# Patient Record
Sex: Male | Born: 1985 | Race: White | Hispanic: No | Marital: Married | Smoking: Never smoker
Health system: Southern US, Community
[De-identification: ages and names within clinical notes are randomized; demographics above are authoritative.]

## PROBLEM LIST (undated history)

## (undated) HISTORY — PX: APPENDECTOMY: SHX54

---

## 2010-05-22 ENCOUNTER — Ambulatory Visit (INDEPENDENT_AMBULATORY_CARE_PROVIDER_SITE_OTHER): Payer: Managed Care, Other (non HMO) | Admitting: Psychology

## 2010-05-22 DIAGNOSIS — F909 Attention-deficit hyperactivity disorder, unspecified type: Secondary | ICD-10-CM

## 2010-06-21 ENCOUNTER — Encounter (HOSPITAL_COMMUNITY): Payer: Managed Care, Other (non HMO) | Admitting: Psychology

## 2010-07-10 ENCOUNTER — Encounter (INDEPENDENT_AMBULATORY_CARE_PROVIDER_SITE_OTHER): Payer: Managed Care, Other (non HMO) | Admitting: Psychology

## 2010-07-10 DIAGNOSIS — F909 Attention-deficit hyperactivity disorder, unspecified type: Secondary | ICD-10-CM

## 2010-07-16 ENCOUNTER — Encounter (INDEPENDENT_AMBULATORY_CARE_PROVIDER_SITE_OTHER): Payer: Managed Care, Other (non HMO) | Admitting: Psychology

## 2010-07-16 DIAGNOSIS — F909 Attention-deficit hyperactivity disorder, unspecified type: Secondary | ICD-10-CM

## 2011-11-18 ENCOUNTER — Emergency Department (HOSPITAL_COMMUNITY)
Admission: EM | Admit: 2011-11-18 | Discharge: 2011-11-19 | Disposition: A | Discharge status: Home / Self Care | Payer: Managed Care, Other (non HMO) | Attending: Emergency Medicine | Admitting: Emergency Medicine

## 2011-11-18 DIAGNOSIS — IMO0002 Reserved for concepts with insufficient information to code with codable children: Secondary | ICD-10-CM | POA: Insufficient documentation

## 2011-11-18 DIAGNOSIS — T169XXA Foreign body in ear, unspecified ear, initial encounter: Secondary | ICD-10-CM | POA: Insufficient documentation

## 2011-11-18 DIAGNOSIS — T162XXA Foreign body in left ear, initial encounter: Secondary | ICD-10-CM

## 2011-11-18 NOTE — ED Provider Notes (Signed)
History     CSN: [id number]  Arrival date & time 11/18/11  2321   First MD Initiated Contact with Patient 11/18/11 2343      No chief complaint on file.   (Consider location/radiation/quality/duration/timing/severity/associated sxs/prior treatment) HPI Comments: The patient reports that he had acute onset of left ear pain just prior to arrival when he felt like an insect flew into his left ear. The symptoms are constant, rapidly improved after he poured mineral oil into his left ear and tried to irrigate out the bug. The insect is no longer moving, he has no associated vertigo.  The history is provided by the patient and the spouse.    History reviewed. No pertinent past medical history.  History reviewed. No pertinent past surgical history.  History reviewed. No pertinent family history.  History  Substance Use Topics  . Smoking status: Not on file  . Smokeless tobacco: Not on file  . Alcohol Use: No      Review of Systems  HENT: Negative for ear discharge.   Gastrointestinal: Negative for nausea and vomiting.  Neurological: Negative for dizziness.    Allergies  Review of patient's allergies indicates no known allergies.  Home Medications  No current outpatient prescriptions on file.  BP 130/72  Pulse 98  Temp 97.9 F (36.6 C) (Oral)  Resp 24  Ht 6\' 4"  (1.93 m)  Wt 230 lb (104.327 kg)  BMI 28.00 kg/m2  SpO2 98%  Physical Exam  Nursing note and vitals reviewed. Constitutional: He appears well-developed and well-nourished. No distress.  HENT:  Head: Normocephalic and atraumatic.       Right external auditory canal is clear, tympanic membrane normal, left tympanic membrane is erythematous, external auditory canal has insect present which is not moving.  Pulmonary/Chest: Effort normal.  Neurological: He is alert. Coordination normal.       Normal speech memory and gait  Skin: Skin is warm and dry.  Psychiatric: He has a normal mood and affect.    ED  Course  FOREIGN BODY REMOVAL Date/Time: 11/19/2011 1:29 AM Performed by: Eber Hong D Authorized by: Eber Hong D Consent: Verbal consent obtained. Risks and benefits: risks, benefits and alternatives were discussed Consent given by: patient Patient understanding: patient states understanding of the procedure being performed Patient identity confirmed: verbally with patient Body area: ear Location details: left ear Local anesthetic: topical anesthetic Patient sedated: no Patient cooperative: yes Localization method: ENT speculum Removal mechanism: suction Complexity: simple 1 objects recovered. Objects recovered: insect Post-procedure assessment: foreign body removed Patient tolerance: Patient tolerated the procedure well with no immediate complications.   (including critical care time)  Labs Reviewed - No data to display No results found.   1. Foreign body of ear, left       MDM  Will attempt to remove foreign body, the insect appears dead at this time, the patient is in no distress   Insect removed in completeness, patient well appearing stable for discharge. No signs of injury to the tympanic membrane.  Vida Roller, MD 11/19/11 0130

## 2011-11-19 ENCOUNTER — Encounter (HOSPITAL_COMMUNITY): Payer: Self-pay | Admitting: *Deleted

## 2011-11-19 MED ORDER — LIDOCAINE VISCOUS 2 % MT SOLN
OROMUCOSAL | Status: AC
  Administered 2011-11-19: 01:00:00
  Filled 2011-11-19: qty 15

## 2011-11-19 NOTE — ED Notes (Signed)
Pt reporting bug in ear, noted tonight.

## 2014-02-27 ENCOUNTER — Encounter (HOSPITAL_COMMUNITY): Payer: Self-pay | Admitting: *Deleted

## 2014-02-27 ENCOUNTER — Emergency Department (HOSPITAL_COMMUNITY): Payer: BC Managed Care – PPO

## 2014-02-27 ENCOUNTER — Emergency Department (HOSPITAL_COMMUNITY)
Admission: EM | Admit: 2014-02-27 | Discharge: 2014-02-28 | Disposition: A | Discharge status: Home / Self Care | Payer: BC Managed Care – PPO | Attending: Emergency Medicine | Admitting: Emergency Medicine

## 2014-02-27 DIAGNOSIS — Y9366 Activity, soccer: Secondary | ICD-10-CM | POA: Insufficient documentation

## 2014-02-27 DIAGNOSIS — S93402A Sprain of unspecified ligament of left ankle, initial encounter: Secondary | ICD-10-CM | POA: Diagnosis not present

## 2014-02-27 DIAGNOSIS — Y998 Other external cause status: Secondary | ICD-10-CM | POA: Diagnosis not present

## 2014-02-27 DIAGNOSIS — S99912A Unspecified injury of left ankle, initial encounter: Secondary | ICD-10-CM | POA: Diagnosis present

## 2014-02-27 DIAGNOSIS — X58XXXA Exposure to other specified factors, initial encounter: Secondary | ICD-10-CM | POA: Insufficient documentation

## 2014-02-27 DIAGNOSIS — T1490XA Injury, unspecified, initial encounter: Secondary | ICD-10-CM

## 2014-02-27 DIAGNOSIS — Y9239 Other specified sports and athletic area as the place of occurrence of the external cause: Secondary | ICD-10-CM | POA: Insufficient documentation

## 2014-02-27 MED ORDER — ONDANSETRON HCL 4 MG PO TABS: 4.0000 mg | ORAL_TABLET | Freq: Four times a day (QID) | ORAL | Status: DC | PRN

## 2014-02-27 MED ORDER — HYDROCODONE-ACETAMINOPHEN 5-325 MG PO TABS: ORAL_TABLET | ORAL | Status: DC

## 2014-02-27 MED ORDER — ONDANSETRON 4 MG PO TBDP
4.0000 mg | ORAL_TABLET | Freq: Once | ORAL | Status: AC
  Administered 2014-02-27: 4 mg via ORAL
  Filled 2014-02-27: qty 1

## 2014-02-27 MED ORDER — HYDROCODONE-ACETAMINOPHEN 5-325 MG PO TABS
1.0000 | ORAL_TABLET | Freq: Once | ORAL | Status: AC
  Administered 2014-02-27: 1 via ORAL
  Filled 2014-02-27: qty 1

## 2014-02-27 NOTE — ED Notes (Signed)
Patient was playing soccer in an indoor league and rolled his left ankle   Ankle swelling, ice bag applied

## 2014-02-27 NOTE — Discharge Instructions (Signed)
Rest, Ice intermittently (in the first 24-48 hours), Gentle compression with an Ace wrap, and elevate (Limb above the level of the heart) °  °Take up to 800mg of ibuprofen (that is usually 4 over the counter pills)  3 times a day for 5 days. Take with food. ° °Take vicodin for breakthrough pain, do not drink alcohol, drive, care for children or do other critical tasks while taking vicodin. ° °Please follow with your primary care doctor in the next 2 days for a check-up. They must obtain records for further management.  ° °Do not hesitate to return to the Emergency Department for any new, worsening or concerning symptoms.  ° ° °Ankle Sprain °An ankle sprain is an injury to the strong, fibrous tissues (ligaments) that hold the bones of your ankle joint together.  °CAUSES °An ankle sprain is usually caused by a fall or by twisting your ankle. Ankle sprains most commonly occur when you step on the outer edge of your foot, and your ankle turns inward. People who participate in sports are more prone to these types of injuries.  °SYMPTOMS  °· Pain in your ankle. The pain may be present at rest or only when you are trying to stand or walk. °· Swelling. °· Bruising. Bruising may develop immediately or within 1 to 2 days after your injury. °· Difficulty standing or walking, particularly when turning corners or changing directions. °DIAGNOSIS  °Your caregiver will ask you details about your injury and perform a physical exam of your ankle to determine if you have an ankle sprain. During the physical exam, your caregiver will press on and apply pressure to specific areas of your foot and ankle. Your caregiver will try to move your ankle in certain ways. An X-ray exam may be done to be sure a bone was not broken or a ligament did not separate from one of the bones in your ankle (avulsion fracture).  °TREATMENT  °Certain types of braces can help stabilize your ankle. Your caregiver can make a recommendation for this. Your  caregiver may recommend the use of medicine for pain. If your sprain is severe, your caregiver may refer you to a surgeon who helps to restore function to parts of your skeletal system (orthopedist) or a physical therapist. °HOME CARE INSTRUCTIONS  °· Apply ice to your injury for 1-2 days or as directed by your caregiver. Applying ice helps to reduce inflammation and pain. °¨ Put ice in a plastic bag. °¨ Place a towel between your skin and the bag. °¨ Leave the ice on for 15-20 minutes at a time, every 2 hours while you are awake. °· Only take over-the-counter or prescription medicines for pain, discomfort, or fever as directed by your caregiver. °· Elevate your injured ankle above the level of your heart as much as possible for 2-3 days. °· If your caregiver recommends crutches, use them as instructed. Gradually put weight on the affected ankle. Continue to use crutches or a cane until you can walk without feeling pain in your ankle. °· If you have a plaster splint, wear the splint as directed by your caregiver. Do not rest it on anything harder than a pillow for the first 24 hours. Do not put weight on it. Do not get it wet. You may take it off to take a shower or bath. °· You may have been given an elastic bandage to wear around your ankle to provide support. If the elastic bandage is too tight (you have numbness   or tingling in your foot or your foot becomes cold and blue), adjust the bandage to make it comfortable. °· If you have an air splint, you may blow more air into it or let air out to make it more comfortable. You may take your splint off at night and before taking a shower or bath. Wiggle your toes in the splint several times per day to decrease swelling. °SEEK MEDICAL CARE IF:  °· You have rapidly increasing bruising or swelling. °· Your toes feel extremely cold or you lose feeling in your foot. °· Your pain is not relieved with medicine. °SEEK IMMEDIATE MEDICAL CARE IF: °· Your toes are numb or  blue. °· You have severe pain that is increasing. °MAKE SURE YOU:  °· Understand these instructions. °· Will watch your condition. °· Will get help right away if you are not doing well or get worse. °Document Released: 03/10/2005 Document Revised: 12/03/2011 Document Reviewed: 03/22/2011 °[employer]® Patient Information ©2015 [employer]. This information is not intended to replace advice given to you by your health care provider. Make sure you discuss any questions you have with your health care provider. ° °

## 2014-02-27 NOTE — ED Provider Notes (Signed)
CSN: [id number]     Arrival date & time 02/27/14  2114 History  This chart was scribed for Monico Blitz, PA-C, working with Evelina Bucy, MD found by Starleen Arms, ED Scribe. This patient was seen in room TR09C/TR09C and the patient's care was started at 11:20 PM.  Chief Complaint  Patient presents with  . Ankle Injury   The history is provided by the patient. No language interpreter was used.   HPI Comments: Brandon Navarro is a 28 y.o. male who presents to the Emergency Department complaining of a rolling left ankle injury sustained tonight while playing indoor soccer.  He reports current 4/10 pain in the affected ankle that is aggravated by bearing weight. Patient denies other injuries.    History reviewed. No pertinent past medical history. Past Surgical History  Procedure Laterality Date  . Appendectomy     No family history on file. History  Substance Use Topics  . Smoking status: Never Smoker   . Smokeless tobacco: Never Used  . Alcohol Use: Yes     Comment: ocassionally    Review of Systems A complete 10 system review of systems was obtained and all systems are negative except as noted in the HPI and PMH.   Allergies  Review of patient's allergies indicates no known allergies.  Home Medications   Prior to Admission medications   Not on File   BP 129/81 mmHg  Pulse 71  Temp(Src) 98.5 F (36.9 C) (Oral)  Resp 18  Ht 6\' 4"  (1.93 m)  Wt 226 lb (102.513 kg)  BMI 27.52 kg/m2  SpO2 96% Physical Exam  Constitutional: He is oriented to person, place, and time. He appears well-developed and well-nourished. No distress.  HENT:  Head: Normocephalic and atraumatic.  Eyes: Conjunctivae and EOM are normal.  Neck: Neck supple. No tracheal deviation present.  Cardiovascular: Normal rate.   Pulmonary/Chest: Effort normal. No respiratory distress.  Musculoskeletal:  Left ankle with significantly swollen inferior lateral malleolus.  NVI.  No skin changes.     Neurological: He is alert and oriented to person, place, and time.  Skin: Skin is warm and dry.  Psychiatric: He has a normal mood and affect. His behavior is normal.  Nursing note and vitals reviewed.   ED Course  Procedures (including critical care time)  DIAGNOSTIC STUDIES: Oxygen Saturation is 96% on RA, adeqaute by my interpretation.    COORDINATION OF CARE:  11:24 PM Discussed treatment plan with patient at bedside including pain medication, ACE wrap, crutches, .  Patient acknowledges and agrees with plan.    Labs Review Labs Reviewed - No data to display  Imaging Review Dg Ankle Complete Left  02/27/2014   CLINICAL DATA:  Left lateral ankle pain. Injury tonight; pt rolled his ankle while playing indoor soccer.No previous injury, limited weight bearing.  EXAM: LEFT ANKLE COMPLETE - 3+ VIEW  COMPARISON:  None.  FINDINGS: No fracture. Ankle mortise is normally spaced and aligned. No arthropathic change. There is lateral soft tissue swelling.  IMPRESSION: No fracture or dislocation.   Electronically Signed   By: Lajean Manes M.D.   On: 02/27/2014 22:38     EKG Interpretation None      MDM   Final diagnoses:  Left ankle sprain, initial encounter     Filed Vitals:   02/27/14 2125 02/27/14 2335  BP: 129/81 123/69  Pulse: 71 63  Temp: 98.5 F (36.9 C) 98.4 F (36.9 C)  TempSrc: Oral Oral  Resp: 18 20  Height: 6\' 4"  (1.93 m)   Weight: 226 lb (102.513 kg)   SpO2: 96% 100%    Medications  ondansetron (ZOFRAN-ODT) disintegrating tablet 4 mg (4 mg Oral Given 02/27/14 2354)  HYDROcodone-acetaminophen (NORCO/VICODIN) 5-325 MG per tablet 1 tablet (1 tablet Oral Given 02/27/14 2354)    Brandon Navarro is a pleasant 28 y.o. male presenting with ankle pain. Patient is ambulatory. No point tenderness or bony tenderness to palpation. Neurovascularly intact. X-rays negative. Patient will be given crutches, Ace wrap, and advised to maintain RICE.   Evaluation does not  show pathology that would require ongoing emergent intervention or inpatient treatment. Pt is hemodynamically stable and mentating appropriately. Discussed findings and plan with patient/guardian, who agrees with care plan. All questions answered. Return precautions discussed and outpatient follow up given.   Discharge Medication List as of 02/27/2014 11:32 PM    START taking these medications   Details  HYDROcodone-acetaminophen (NORCO/VICODIN) 5-325 MG per tablet Take 1-2 tablets by mouth every 6 hours as needed for pain., Print    ondansetron (ZOFRAN) 4 MG tablet Take 1 tablet (4 mg total) by mouth every 6 (six) hours as needed for nausea or vomiting., Starting 02/27/2014, Until Discontinued, Print       I personally performed the services described in this documentation, which was scribed in my presence.  The recorded information has been reviewed and is accurate.   Monico Blitz, PA-C 02/28/14 0040  Evelina Bucy, MD 02/28/14 [phone]

## 2016-07-01 IMAGING — CR DG ANKLE COMPLETE 3+V*L*
3 series · 3 of 3 positions shown · non-contrast
Comparison: None.

CLINICAL DATA: Left lateral ankle pain. Injury tonight; pt rolled
his ankle while playing indoor soccer.No previous injury, limited
weight bearing.

EXAM:
LEFT ANKLE COMPLETE - 3+ VIEW

[ankle ap]
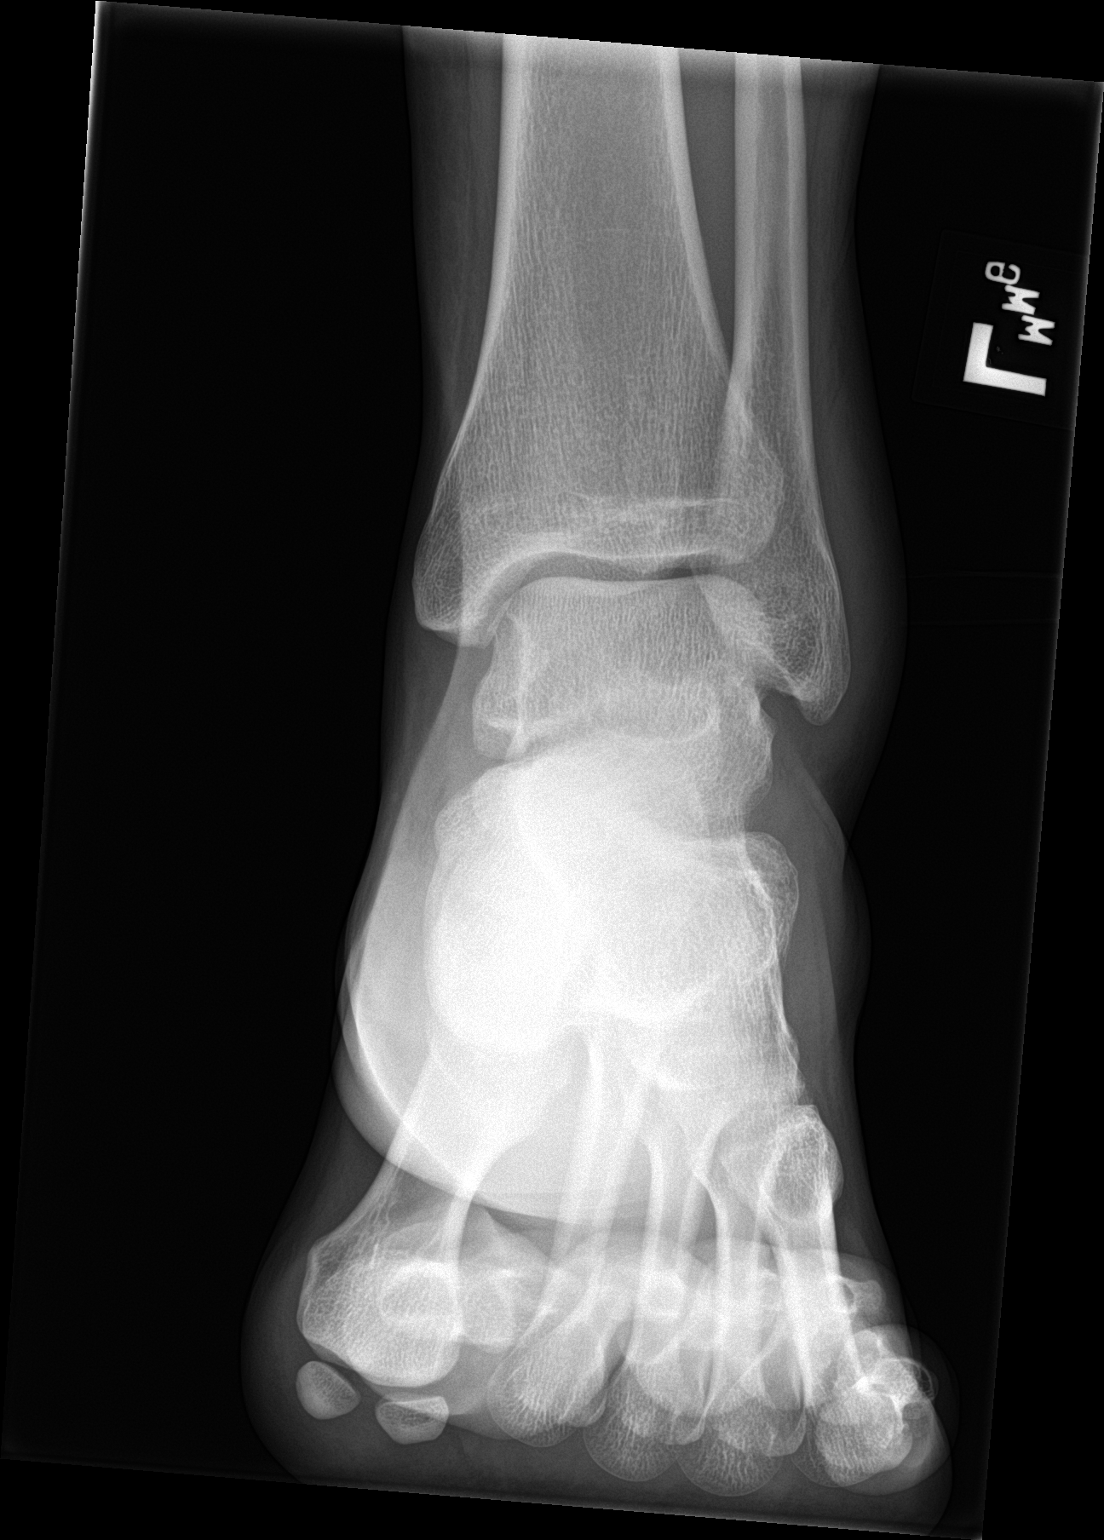

[ankle obl]
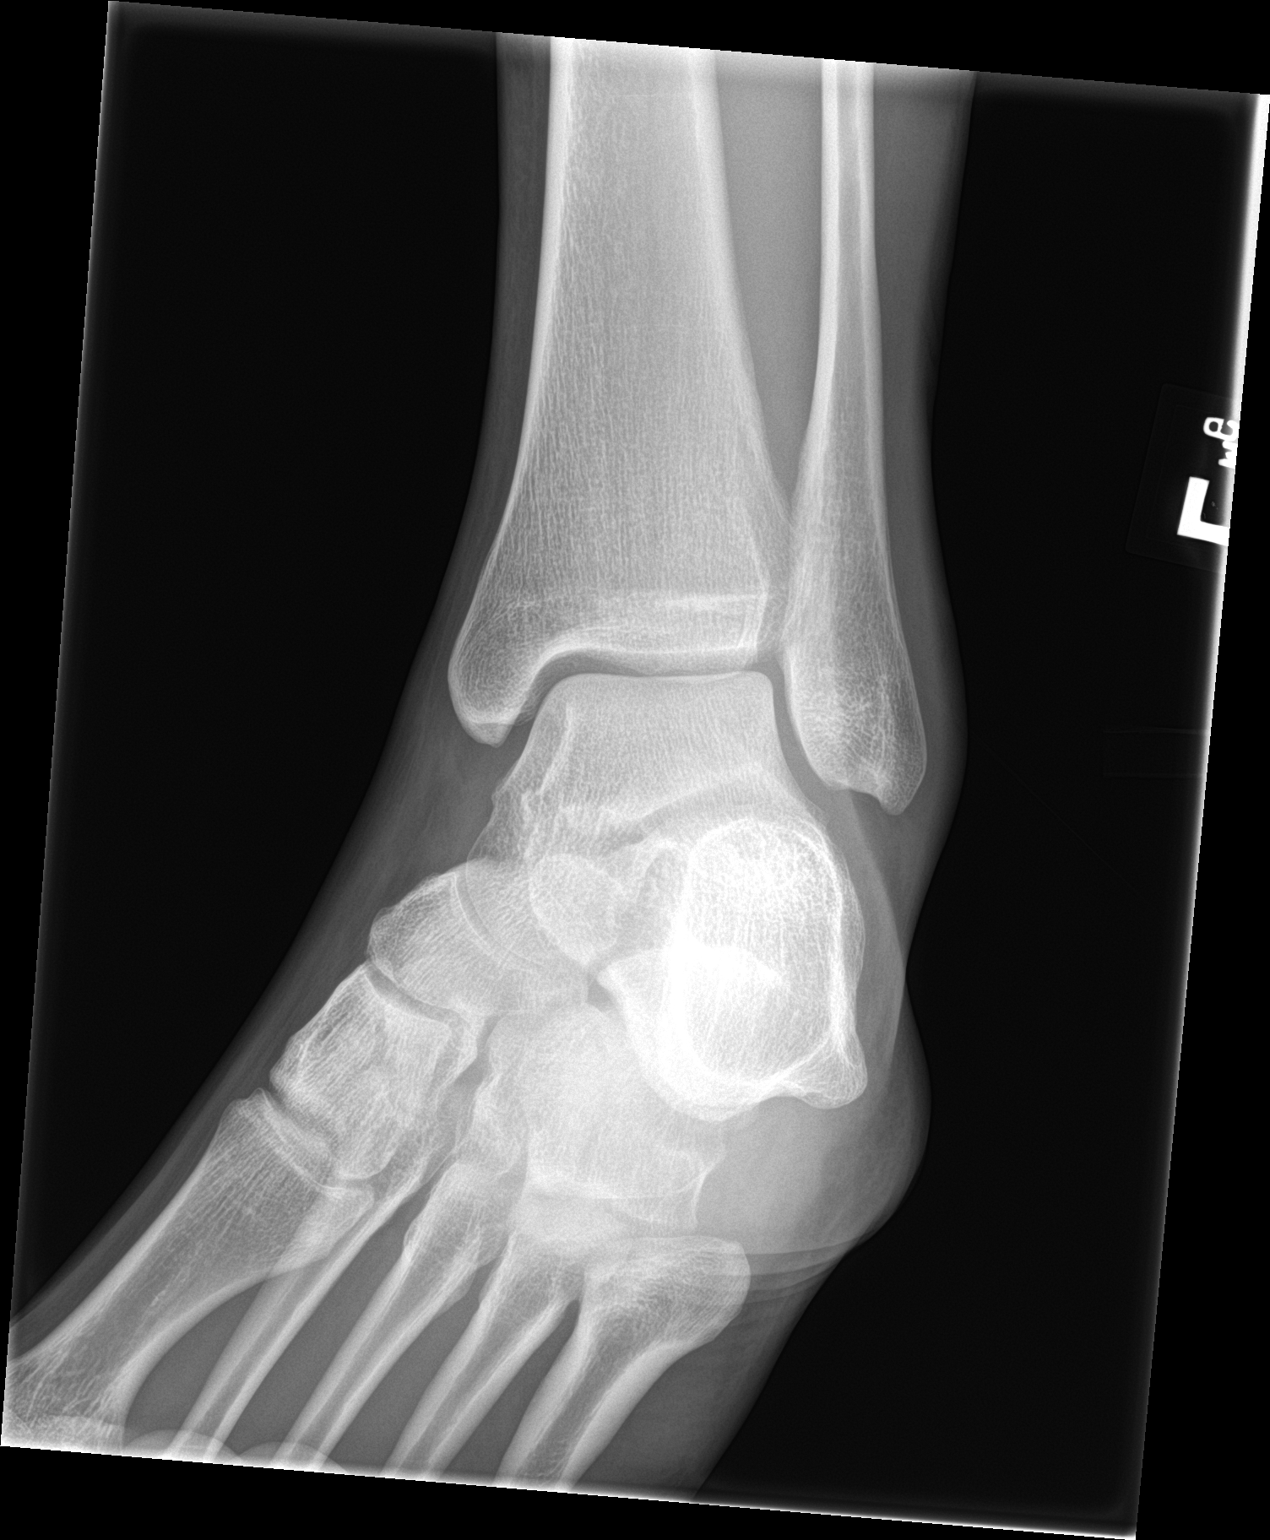

[ankle lat]
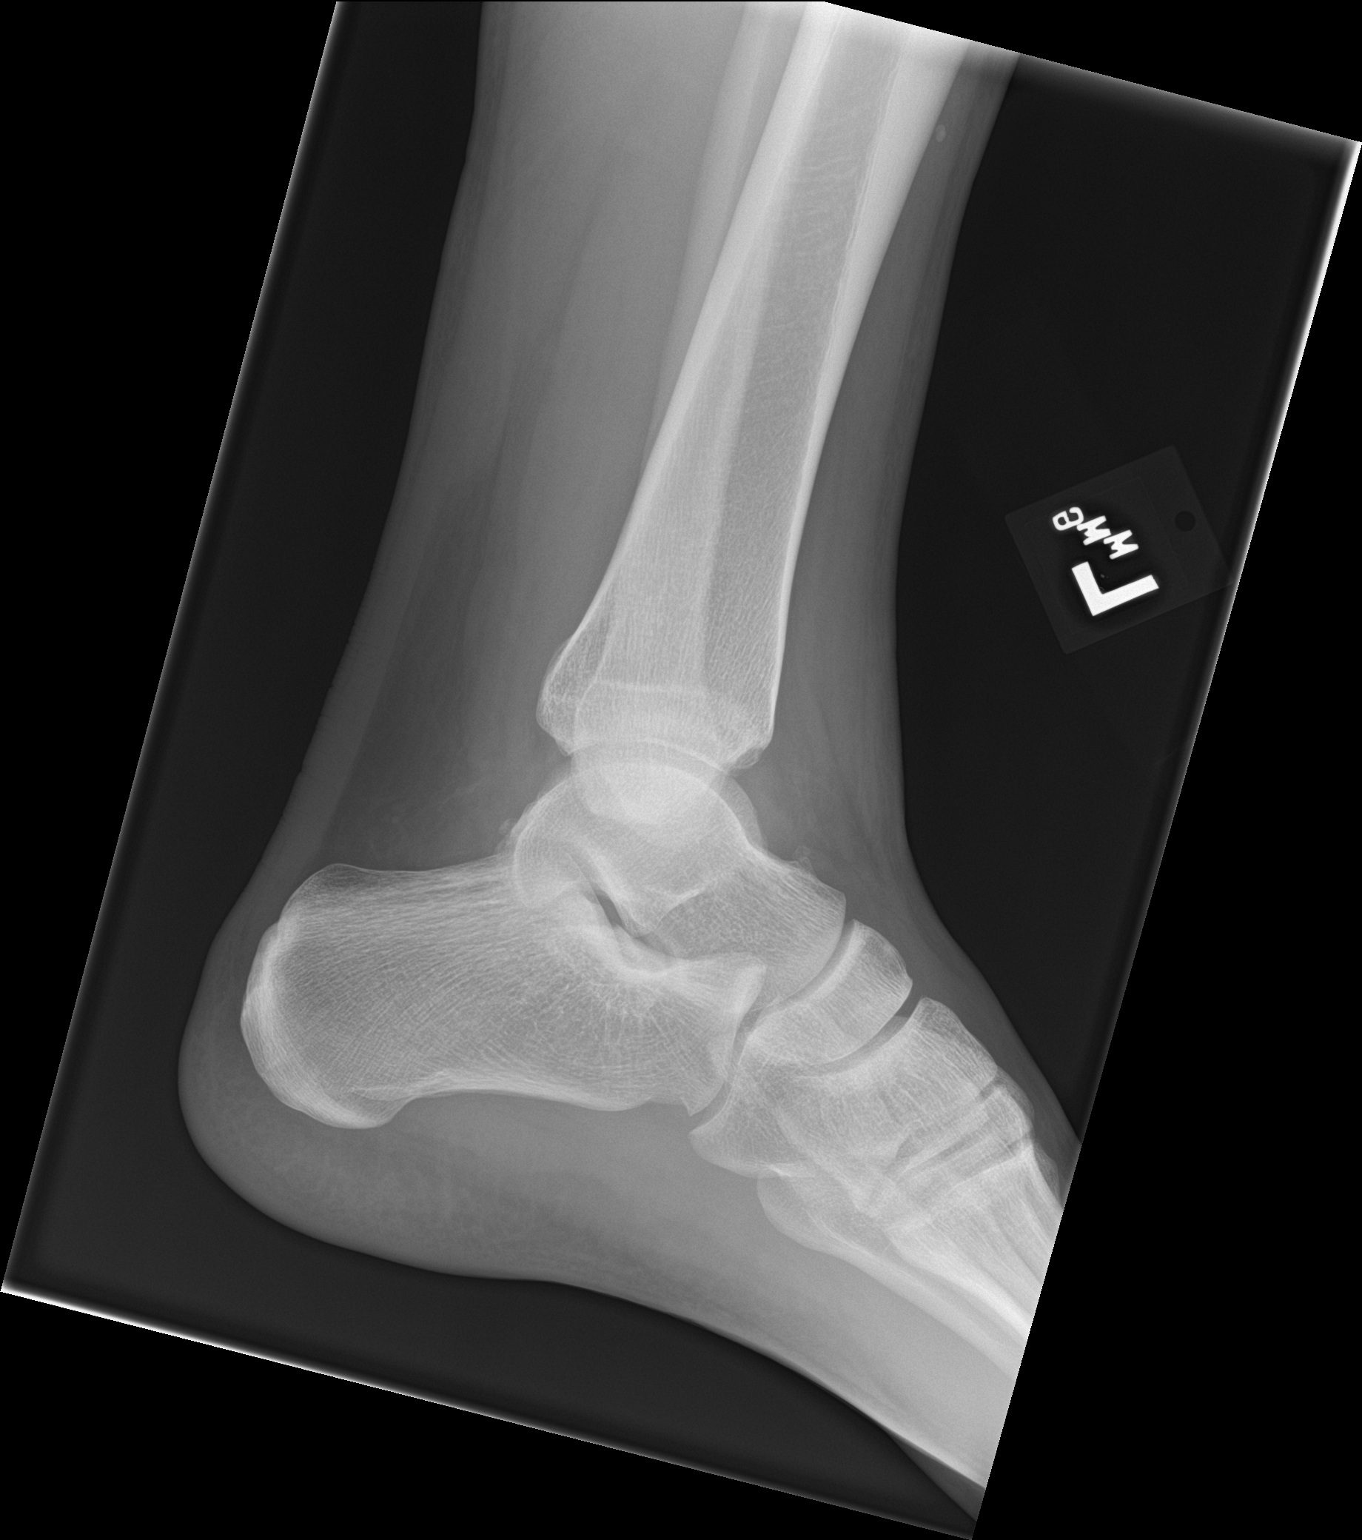

[3 of 3 positions shown; findings below may reference images not displayed]

FINDINGS: No fracture. Ankle mortise is normally spaced and aligned. No
arthropathic change. There is lateral soft tissue swelling.
IMPRESSION: No fracture or dislocation.

## 2017-02-13 DIAGNOSIS — Z23 Encounter for immunization: Secondary | ICD-10-CM | POA: Diagnosis not present

## 2017-08-27 DIAGNOSIS — Z0000 Encounter for general adult medical examination without abnormal findings: Secondary | ICD-10-CM | POA: Diagnosis not present

## 2021-10-08 ENCOUNTER — Ambulatory Visit (INDEPENDENT_AMBULATORY_CARE_PROVIDER_SITE_OTHER): Payer: 59 | Admitting: Dermatology

## 2021-10-08 DIAGNOSIS — L821 Other seborrheic keratosis: Secondary | ICD-10-CM

## 2021-10-08 DIAGNOSIS — L918 Other hypertrophic disorders of the skin: Secondary | ICD-10-CM | POA: Diagnosis not present

## 2021-10-08 DIAGNOSIS — D1801 Hemangioma of skin and subcutaneous tissue: Secondary | ICD-10-CM

## 2021-10-08 DIAGNOSIS — Z1283 Encounter for screening for malignant neoplasm of skin: Secondary | ICD-10-CM

## 2021-10-08 DIAGNOSIS — B078 Other viral warts: Secondary | ICD-10-CM

## 2021-10-28 ENCOUNTER — Encounter: Payer: Self-pay | Admitting: Dermatology

## 2021-10-28 NOTE — Progress Notes (Signed)
   New Patient   Subjective  Brandon Navarro is a 36 y.o. male who presents for the following: New Patient (Initial Visit) (Here for new patient skin exam. Family history of skin cancer. Patients grandfather died of melanoma. No concerns for today.).  General skin examination, grandparent with history of melanoma Location:  Duration:  Quality:  Associated Signs/Symptoms: Modifying Factors:  Severity:  Timing: Context:    The following portions of the chart were reviewed this encounter and updated as appropriate:  Tobacco  Allergies  Meds  Problems  Med Hx  Surg Hx  Fam Hx      Objective  Well appearing patient in no apparent distress; mood and affect are within normal limits. Full body skin exam: No atypical pigmented lesions (all checked with dermoscopy), no nonmelanoma skin cancer  Right Supraorbital Region 2 mm smooth red dermal papule  Left Anterior Neck Several 1 mm pedunculated flesh-colored papules  Right Thigh - Anterior Tan textured 4 mm slightly verrucous papule  Left Dorsal Hand Pink 3 mm textured papule    A full examination was performed including scalp, head, eyes, ears, nose, lips, neck, chest, axillae, abdomen, back, buttocks, bilateral upper extremities, bilateral lower extremities, hands, feet, fingers, toes, fingernails, and toenails. All findings within normal limits unless otherwise noted below.   Assessment & Plan  Screening for malignant neoplasm of skin  Encouraged to self examine with spouse twice annually, continued ultraviolet protection.  Optional visit to dermatologist every 1 to 3 years.  Hemangioma of skin Right Supraorbital Region  No intervention necessary  Skin tag Left Anterior Neck  May choose to remove in future  Seborrheic keratosis Right Thigh - Anterior  Leave if stable  Other viral warts Left Dorsal Hand  May try at home freezing

## 2023-05-19 ENCOUNTER — Encounter: Payer: Self-pay | Admitting: "Endocrinology

## 2023-05-19 ENCOUNTER — Ambulatory Visit (INDEPENDENT_AMBULATORY_CARE_PROVIDER_SITE_OTHER): Payer: 59 | Admitting: "Endocrinology

## 2023-05-19 VITALS — BP 104/76 | HR 68 | Ht 75.0 in | Wt 228.6 lb

## 2023-05-19 DIAGNOSIS — E291 Testicular hypofunction: Secondary | ICD-10-CM | POA: Diagnosis not present

## 2023-05-19 DIAGNOSIS — E782 Mixed hyperlipidemia: Secondary | ICD-10-CM | POA: Diagnosis not present

## 2023-05-19 NOTE — Progress Notes (Unsigned)
 Endocrinology Consult Note                                            05/19/2023, 3:28 PM   Subjective:    Patient ID: Brandon Navarro, male    DOB: [birth year], PCP Selinda Flavin, MD   History reviewed. No pertinent past medical history. Past Surgical History:  Procedure Laterality Date  . APPENDECTOMY     Social History   Socioeconomic History  . Marital status: Married    Spouse name: Not on file  . Number of children: Not on file  . Years of education: Not on file  . Highest education level: Not on file  Occupational History  . Not on file  Tobacco Use  . Smoking status: Never  . Smokeless tobacco: Never  Vaping Use  . Vaping status: Never Used  Substance and Sexual Activity  . Alcohol use: Yes    Comment: ocassionally  . Drug use: No  . Sexual activity: Yes  Other Topics Concern  . Not on file  Social History Narrative  . Not on file   Social Drivers of Health   Financial Resource Strain: Not on file  Food Insecurity: Not on file  Transportation Needs: Not on file  Physical Activity: Not on file  Stress: Not on file  Social Connections: Not on file   Family History  Problem Relation Age of Onset  . Hyperlipidemia Mother    Outpatient Encounter Medications as of 05/19/2023  Medication Sig  . MULTIPLE VITAMINS PO Take 1 tablet by mouth daily.  . [DISCONTINUED] HYDROcodone-acetaminophen (NORCO/VICODIN) 5-325 MG per tablet Take 1-2 tablets by mouth every 6 hours as needed for pain.  . [DISCONTINUED] ondansetron (ZOFRAN) 4 MG tablet Take 1 tablet (4 mg total) by mouth every 6 (six) hours as needed for nausea or vomiting.  . [DISCONTINUED] Testosterone 25 MG/2.5GM (1%) GEL SMARTSIG:1 Packet(s) T-DERMAL Daily   No facility-administered encounter medications on file as of 05/19/2023.   ALLERGIES: No Known Allergies  VACCINATION STATUS:  There is no immunization history on file for this patient.  HPI Brandon Navarro is 38 y.o. male who  presents today with a medical history as above. he is being seen in consultation for *** requested by Selinda Flavin, MD.  he has been dealing with symptoms of  ***, ***, ***, and *** for ***. he denies ***.  Review of Systems  Constitutional: ***mildly fluctuating body weight with recent weight ***, no fatigue, no subjective hyperthermia, no subjective hypothermia Eyes: no blurry vision, no xerophthalmia ENT: no sore throat, no nodules palpated in throat, no dysphagia/odynophagia, no hoarseness Cardiovascular: no Chest Pain, no Shortness of Breath, no palpitations, no leg swelling Respiratory: no cough, no shortness of breath Gastrointestinal: no Nausea/Vomiting/Diarhhea Musculoskeletal: no muscle/joint aches Skin: no rashes Neurological: no tremors, no numbness, no tingling, no dizziness Psychiatric: no depression, no anxiety  Objective:       05/19/2023    2:39 PM 02/27/2014   11:35 PM 02/27/2014    9:25 PM  Vitals with BMI  Height 6\' 3"   6\' 4"   Weight 228 lbs 10 oz  226 lbs  BMI 28.57  27.6  Systolic 104 123 962  Diastolic 76 69 81  Pulse 68 63 71    BP 104/76   Pulse 68   Ht 6\' 3"  (1.905  m)   Wt 228 lb 9.6 oz (103.7 kg)   BMI 28.57 kg/m   Wt Readings from Last 3 Encounters:  05/19/23 228 lb 9.6 oz (103.7 kg)  02/27/14 226 lb (102.5 kg)  11/18/11 230 lb (104.3 kg)    Physical Exam  Constitutional:  Body mass index is 28.57 kg/m.,  not in acute distress, normal state of mind Eyes: PERRLA, EOMI, no exophthalmos ENT: moist mucous membranes, no gross thyromegaly, no gross cervical lymphadenopathy Cardiovascular: normal precordial activity, Regular Rate and Rhythm, no Murmur/Rubs/Gallops Respiratory:  adequate breathing efforts, no gross chest deformity, Clear to auscultation bilaterally Gastrointestinal: abdomen soft, Non -tender, No distension, Bowel Sounds present, no gross organomegaly Musculoskeletal: no gross deformities, strength intact in all four extremities,  no peripheral edema Skin: moist, warm, no rashes Neurological: no tremor with outstretched hands, Deep tendon reflexes normal in bilateral lower extremities.  CMP ( most recent) CMP  No results found for: "NA", "K", "CL", "CO2", "GLUCOSE", "BUN", "CREATININE", "CALCIUM", "PROT", "ALBUMIN", "AST", "ALT", "ALKPHOS", "BILITOT", "GFR", "EGFR", "GFRNONAA"   Diabetic Labs (most recent): No results found for: "HGBA1C", "MICROALBUR"   Lipid Panel ( most recent) Lipid Panel  No results found for: "CHOL", "TRIG", "HDL", "CHOLHDL", "VLDL", "LDLCALC", "LDLDIRECT", "LABVLDL"    No results found for: "TSH", "FREET4"         Assessment & Plan:   1. 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 Mixed hyperlipidemia *** - Lipid panel   - Brandon Navarro  is being seen at a kind request of Selinda Flavin, MD. - I have reviewed his available *** records and clinically evaluated the patient. - Based on these reviews, he has ***,  however,  there is not sufficient information to proceed with definitive treatment plan.  - he will need a repeat,  more complete *** towards confirming the diagnosis.  -he will return in *** week to review his repeat labs.   If his  labs are suggestive of ***, he will be considered for *** to confirm the diagnosis.  - I did not initiate any new prescriptions today. - he is advised to maintain close follow up with Selinda Flavin, MD for primary care needs.   -Thank you for involving me in the care of this pleasant patient.  Time spent with the patient: ***  minutes, of which >50% was spent in  counseling him about his *** and the rest in obtaining information about his symptoms, reviewing his previous labs/studies ( including abstractions from other facilities),  evaluations, and treatments,  and developing a plan to  confirm diagnosis and long term treatment based on the latest standards of care/guidelines; and documenting his care.  Hollace Hayward participated in the discussions, expressed understanding, and voiced agreement with the above plans.  All questions were answered to his satisfaction. he is encouraged to contact clinic should he have any questions or concerns prior to his return visit.  Follow up plan: Return in about 2 weeks (around 06/02/2023) for Fasting Labs  in AM B4 8.   Marquis Lunch, MD [facility] [address] [place], [place] [address] Phone: [phone]  Fax: [phone]     05/19/2023, 3:28 PM  This note was partially dictated with voice recognition software. Similar sounding words can be transcribed inadequately or may not  be corrected upon review.

## 2023-05-30 LAB — CBC WITH DIFFERENTIAL/PLATELET
Basophils Absolute: 0 10*3/uL (ref 0.0–0.2)
Basos: 0 %
EOS (ABSOLUTE): 0.1 10*3/uL (ref 0.0–0.4)
Eos: 3 %
Hematocrit: 46.2 % (ref 37.5–51.0)
Hemoglobin: 15.1 g/dL (ref 13.0–17.7)
Immature Grans (Abs): 0 10*3/uL (ref 0.0–0.1)
Immature Granulocytes: 0 %
Lymphocytes Absolute: 2 10*3/uL (ref 0.7–3.1)
Lymphs: 40 %
MCH: 29.8 pg (ref 26.6–33.0)
MCHC: 32.7 g/dL (ref 31.5–35.7)
MCV: 91 fL (ref 79–97)
Monocytes Absolute: 0.6 10*3/uL (ref 0.1–0.9)
Monocytes: 12 %
Neutrophils Absolute: 2.3 10*3/uL (ref 1.4–7.0)
Neutrophils: 45 %
Platelets: 179 10*3/uL (ref 150–450)
RBC: 5.07 x10E6/uL (ref 4.14–5.80)
RDW: 12.9 % (ref 11.6–15.4)
WBC: 5.1 10*3/uL (ref 3.4–10.8)

## 2023-05-30 LAB — PROLACTIN: Prolactin: 8.6 ng/mL (ref 3.9–22.7)

## 2023-05-30 LAB — LIPID PANEL
Chol/HDL Ratio: 4.9 ratio (ref 0.0–5.0)
Cholesterol, Total: 210 mg/dL — ABNORMAL HIGH (ref 100–199)
HDL: 43 mg/dL (ref >39)
LDL Chol Calc (NIH): 147 mg/dL — ABNORMAL HIGH (ref 0–99)
Triglycerides: 113 mg/dL (ref 0–149)
VLDL Cholesterol Cal: 20 mg/dL (ref 5–40)

## 2023-05-30 LAB — TESTOSTERONE, FREE, TOTAL, SHBG
Sex Hormone Binding: 21.9 nmol/L (ref 16.5–55.9)
Testosterone, Free: 16.1 pg/mL (ref 8.7–25.1)
Testosterone: 436 ng/dL (ref 264–916)

## 2023-05-30 LAB — LUTEINIZING HORMONE: LH: 2.7 m[IU]/mL (ref 1.7–8.6)

## 2023-05-30 LAB — FERRITIN: Ferritin: 584 ng/mL — ABNORMAL HIGH (ref 30–400)

## 2023-05-30 LAB — FOLLICLE STIMULATING HORMONE: FSH: 1.4 m[IU]/mL — ABNORMAL LOW (ref 1.5–12.4)

## 2023-05-30 LAB — PSA: Prostate Specific Ag, Serum: 0.8 ng/mL (ref 0.0–4.0)

## 2023-06-02 ENCOUNTER — Ambulatory Visit: Payer: 59 | Admitting: "Endocrinology

## 2023-06-04 ENCOUNTER — Ambulatory Visit (INDEPENDENT_AMBULATORY_CARE_PROVIDER_SITE_OTHER): Admitting: "Endocrinology

## 2023-06-04 ENCOUNTER — Encounter: Payer: Self-pay | Admitting: "Endocrinology

## 2023-06-04 VITALS — BP 108/70 | HR 68 | Ht 75.0 in | Wt 226.6 lb

## 2023-06-04 DIAGNOSIS — E782 Mixed hyperlipidemia: Secondary | ICD-10-CM

## 2023-06-04 DIAGNOSIS — R7989 Other specified abnormal findings of blood chemistry: Secondary | ICD-10-CM | POA: Diagnosis not present

## 2023-06-04 DIAGNOSIS — E291 Testicular hypofunction: Secondary | ICD-10-CM | POA: Diagnosis not present

## 2023-06-04 NOTE — Progress Notes (Signed)
 06/04/2023, 10:23 AM  Endocrinology follow-up note   Subjective:    Patient ID: Brandon Navarro, male    DOB: 07-27-1985, PCP Selinda Flavin, MD   History reviewed. No pertinent past medical history. Past Surgical History:  Procedure Laterality Date   APPENDECTOMY     Social History   Socioeconomic History   Marital status: Married    Spouse name: Not on file   Number of children: Not on file   Years of education: Not on file   Highest education level: Not on file  Occupational History   Not on file  Tobacco Use   Smoking status: Never   Smokeless tobacco: Never  Vaping Use   Vaping status: Never Used  Substance and Sexual Activity   Alcohol use: Yes    Comment: ocassionally   Drug use: No   Sexual activity: Yes  Other Topics Concern   Not on file  Social History Narrative   Not on file   Social Drivers of Health   Financial Resource Strain: Not on file  Food Insecurity: Not on file  Transportation Needs: Not on file  Physical Activity: Not on file  Stress: Not on file  Social Connections: Not on file   Family History  Problem Relation Age of Onset   Hyperlipidemia Mother    Outpatient Encounter Medications as of 06/04/2023  Medication Sig   MULTIPLE VITAMINS PO Take 1 tablet by mouth daily.   No facility-administered encounter medications on file as of 06/04/2023.   ALLERGIES: No Known Allergies  VACCINATION STATUS:  There is no immunization history on file for this patient.  HPI Brandon Navarro is 38 y.o. male who presents today with a medical history as above. he is being seen in follow-up after he was seen consultation for hypogonadism requested by Selinda Flavin, MD.     He reports that he was diagnosed with hypogonadism in the past was given testosterone gel with mixed results.  He did not receive any testosterone replacement therapy in the last couple of years.  He denies any  significant history of testicular injury, chemotherapy, radiation therapy nor any significant head injury.  He has 2 children ages 99 and 26 years old.  He denies any prior exposure to anabolic, androgenic steroids.  He underwent vasectomy, not looking for any more fertility.  His referral package shows low total testosterone of 252 in June 2023 and another low total testosterone of 250 in October 2024. After his first visit, he was sent for more complete fasting lab work which showed normal range total testosterone-see below.  His medical history indicates hyperlipidemia-not on treatment.  His LDL and total cholesterol still above target.    No  coronary artery disease.  No family history of such concern.  He has engaged in lifestyle modification which allowed him to lose about 12 pounds over the last year. He is active, exercises regularly.  He is a social alcohol user.  He is not a smoker. He does not have acute complaints at this time. Review of Systems  Constitutional: + Lost weight over the last year,  no fatigue, no subjective hyperthermia, no subjective hypothermia   Objective:       06/04/2023  8:30 AM 05/19/2023    2:39 PM 02/27/2014   11:35 PM  Vitals with BMI  Height 6\' 3"  6\' 3"    Weight 226 lbs 10 oz 228 lbs 10 oz   BMI 28.32 28.57   Systolic 108 104 161  Diastolic 70 76 69  Pulse 68 68 63    BP 108/70   Pulse 68   Ht 6\' 3"  (1.905 m)   Wt 226 lb 9.6 oz (102.8 kg)   BMI 28.32 kg/m   Wt Readings from Last 3 Encounters:  06/04/23 226 lb 9.6 oz (102.8 kg)  05/19/23 228 lb 9.6 oz (103.7 kg)  02/27/14 226 lb (102.5 kg)    Physical Exam  Constitutional:  Body mass index is 28.32 kg/m.,  not in acute distress, normal state of mind Eyes: PERRLA, EOMI, no exophthalmos ENT: moist mucous membranes, no gross thyromegaly, no gross cervical lymphadenopathy  Genital: Testicular size 20 cc bilaterally, no intrascrotal mass lesion, no hernia.  Normal male external  escutcheon. Skin: moist, warm, no rashes Neurological: no tremor with outstretched hands, Deep tendon reflexes normal in bilateral lower extremities.   05/20/2021 labs: Total testosterone 253 December 23, 2022 labs: Total testosterone 250  Recent Results (from the past 2160 hours)  Luteinizing hormone     Status: None   Collection Time: 05/26/23  7:57 AM  Result Value Ref Range   LH 2.7 1.7 - 8.6 mIU/mL  Ferritin     Status: Abnormal   Collection Time: 05/26/23  7:57 AM  Result Value Ref Range   Ferritin 584 (H) 30 - 400 ng/mL  Prolactin     Status: None   Collection Time: 05/26/23  7:57 AM  Result Value Ref Range   Prolactin 8.6 3.9 - 22.7 ng/mL  Follicle stimulating hormone     Status: Abnormal   Collection Time: 05/26/23  7:57 AM  Result Value Ref Range   FSH 1.4 (L) 1.5 - 12.4 mIU/mL  CBC with Differential/Platelet     Status: None   Collection Time: 05/26/23  7:57 AM  Result Value Ref Range   WBC 5.1 3.4 - 10.8 x10E3/uL   RBC 5.07 4.14 - 5.80 x10E6/uL   Hemoglobin 15.1 13.0 - 17.7 g/dL   Hematocrit 09.6 04.5 - 51.0 %   MCV 91 79 - 97 fL   MCH 29.8 26.6 - 33.0 pg   MCHC 32.7 31.5 - 35.7 g/dL   RDW 40.9 81.1 - 91.4 %   Platelets 179 150 - 450 x10E3/uL   Neutrophils 45 Not Estab. %   Lymphs 40 Not Estab. %   Monocytes 12 Not Estab. %   Eos 3 Not Estab. %   Basos 0 Not Estab. %   Neutrophils Absolute 2.3 1.4 - 7.0 x10E3/uL   Lymphocytes Absolute 2.0 0.7 - 3.1 x10E3/uL   Monocytes Absolute 0.6 0.1 - 0.9 x10E3/uL   EOS (ABSOLUTE) 0.1 0.0 - 0.4 x10E3/uL   Basophils Absolute 0.0 0.0 - 0.2 x10E3/uL   Immature Granulocytes 0 Not Estab. %   Immature Grans (Abs) 0.0 0.0 - 0.1 x10E3/uL  PSA     Status: None   Collection Time: 05/26/23  7:57 AM  Result Value Ref Range   Prostate Specific Ag, Serum 0.8 0.0 - 4.0 ng/mL    Comment: Roche ECLIA methodology. According to the American Urological Association, Serum PSA should decrease and remain at undetectable levels after  radical prostatectomy. The AUA defines biochemical recurrence as an initial PSA value 0.2 ng/mL or greater  followed by a subsequent confirmatory PSA value 0.2 ng/mL or greater. Values obtained with different assay methods or kits cannot be used interchangeably. Results cannot be interpreted as absolute evidence of the presence or absence of malignant disease.   Testosterone, Free, Total, SHBG     Status: None   Collection Time: 05/26/23  7:57 AM  Result Value Ref Range   Testosterone 436 264 - 916 ng/dL    Comment: Adult male reference interval is based on a population of healthy nonobese males (BMI <30) between 41 and 72 years old. Travison, et.al. JCEM 4582470427. PMID: 91478295.    Testosterone, Free 16.1 8.7 - 25.1 pg/mL   Sex Hormone Binding 21.9 16.5 - 55.9 nmol/L  Lipid panel     Status: Abnormal   Collection Time: 05/26/23  7:57 AM  Result Value Ref Range   Cholesterol, Total 210 (H) 100 - 199 mg/dL   Triglycerides 621 0 - 149 mg/dL   HDL 43 >30 mg/dL   VLDL Cholesterol Cal 20 5 - 40 mg/dL   LDL Chol Calc (NIH) 865 (H) 0 - 99 mg/dL   Chol/HDL Ratio 4.9 0.0 - 5.0 ratio    Comment:                                   T. Chol/HDL Ratio                                             Men  Women                               1/2 Avg.Risk  3.4    3.3                                   Avg.Risk  5.0    4.4                                2X Avg.Risk  9.6    7.1                                3X Avg.Risk 23.4   11.0      Assessment & Plan:   1. Hypogonadism- resolved 2. Mixed hyperlipidemia  - Brandon Navarro  is being seen at a kind request of Selinda Flavin, MD. - I have reviewed his new and available  records and clinically evaluated the patient. 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 His FSH is low and LH is inappropriately normal associated with elevated ferritin of 548.  It  is unclear if he has hemochromatosis in his family. She will not need immediate testosterone replacement therapy, however will need a follow-up measurements as below.  - Luteinizing hormone - Ferritin - Follicle stimulating hormone - Testosterone, Free, Total, SHBG -Lipid panel  He continues to have significant dyslipidemia.  Lifestyle medicine nutrition was discussed and recommended to him.  If he is LDL remains above 130 mg/day, he may benefit from early  intervention with statins.   - he acknowledges that there is a room for improvement in his food and drink choices. - Suggestion is made for him to avoid simple carbohydrates  from his diet including Cakes, Sweet Desserts, Ice Cream, Soda (diet and regular), Sweet Tea, Candies, Chips, Cookies, Store Bought Juices, Alcohol , Artificial Sweeteners,  Coffee Creamer, and "Sugar-free" Products, Lemonade. This will help patient to have more stable blood glucose profile and potentially avoid unintended weight gain.  The following Lifestyle Medicine recommendations according to American College of Lifestyle Medicine  [facility]) were discussed and and offered to patient and he  agrees to start the journey:  A. Whole Foods, Plant-Based Nutrition comprising of fruits and vegetables, plant-based proteins, whole-grain carbohydrates was discussed in detail with the patient.   A list for source of those nutrients were also provided to the patient.  Patient will use only water or unsweetened tea for hydration. B.  The need to stay away from risky substances including alcohol, smoking; obtaining 7 to 9 hours of restorative sleep, at least 150 minutes of moderate intensity exercise weekly, the importance of healthy social connections,  and stress management techniques were discussed. C.  A full color page of  Calorie density of various food groups per pound showing examples of each food groups was provided to the patient.   He is encouraged to stay on the lifestyle  measures to allow him to lose some more weight which might improve total testosterone if it is due to primary testicular failure. He will get more information about family history of hemochromatosis, if ferritin is still high at next visit, he will be considered for hematology consult.  - he is advised to maintain close follow up with Selinda Flavin, MD for primary care needs.   I spent  25  minutes in the care of the patient today including review of labs from Thyroid Function, CMP, and other relevant labs ; imaging/biopsy records (current and previous including abstractions from other facilities); face-to-face time discussing  his lab results and symptoms, medications doses, his options of short and long term treatment based on the latest standards of care / guidelines;   and documenting the encounter.  Hollace Hayward  participated in the discussions, expressed understanding, and voiced agreement with the above plans.  All questions were answered to his satisfaction. he is encouraged to contact clinic should he have any questions or concerns prior to his return visit.   Follow up plan: Return in about 6 months (around 12/05/2023) for Fasting Labs  in AM B4 8.   Marquis Lunch, MD [facility] [address] [place], [place] [address] Phone: [phone]  Fax: [phone]     06/04/2023, 10:23 AM  This note was partially dictated with voice recognition software. Similar sounding words can be transcribed inadequately or may not  be corrected upon review.

## 2023-12-08 ENCOUNTER — Encounter: Payer: Self-pay | Admitting: "Endocrinology

## 2023-12-08 ENCOUNTER — Ambulatory Visit (INDEPENDENT_AMBULATORY_CARE_PROVIDER_SITE_OTHER): Admitting: "Endocrinology

## 2023-12-08 VITALS — BP 106/76 | HR 56 | Ht 75.0 in | Wt 221.6 lb

## 2023-12-08 DIAGNOSIS — E291 Testicular hypofunction: Secondary | ICD-10-CM

## 2023-12-08 DIAGNOSIS — E782 Mixed hyperlipidemia: Secondary | ICD-10-CM

## 2023-12-08 DIAGNOSIS — R7989 Other specified abnormal findings of blood chemistry: Secondary | ICD-10-CM | POA: Diagnosis not present

## 2023-12-08 NOTE — Progress Notes (Signed)
 12/08/2023, 12:01 PM  Endocrinology follow-up note   Subjective:    Patient ID: Brandon Navarro, male    DOB: January 03, 1986, PCP Kayla Drivers, MD   History reviewed. No pertinent past medical history. Past Surgical History:  Procedure Laterality Date   APPENDECTOMY     Social History   Socioeconomic History   Marital status: Married    Spouse name: Not on file   Number of children: Not on file   Years of education: Not on file   Highest education level: Not on file  Occupational History   Not on file  Tobacco Use   Smoking status: Never   Smokeless tobacco: Never  Vaping Use   Vaping status: Never Used  Substance and Sexual Activity   Alcohol use: Yes    Comment: ocassionally   Drug use: No   Sexual activity: Yes  Other Topics Concern   Not on file  Social History Narrative   Not on file   Social Drivers of Health   Financial Resource Strain: Not on file  Food Insecurity: Not on file  Transportation Needs: Not on file  Physical Activity: Not on file  Stress: Not on file  Social Connections: Not on file   Family History  Problem Relation Age of Onset   Hyperlipidemia Mother    Outpatient Encounter Medications as of 12/08/2023  Medication Sig   MULTIPLE VITAMINS PO Take 1 tablet by mouth daily.   No facility-administered encounter medications on file as of 12/08/2023.   ALLERGIES: No Known Allergies  VACCINATION STATUS:  There is no immunization history on file for this patient.  HPI Brandon Navarro is 38 y.o. male who presents today with a medical history as above. he is being seen in follow-up after he was seen in consultation for hypogonadism requested by Kayla Drivers, MD.     He reports that he was diagnosed with hypogonadism in the past was given testosterone  gel with mixed results.  He did not receive any testosterone  replacement therapy in the most recent couple of years.  He denies any  significant history of testicular injury, chemotherapy, radiation therapy nor any significant head injury.  He has 2 children ages 64 and 24 years old.    He denies any prior exposure to anabolic, androgenic steroids.  He underwent vasectomy, not looking for any more fertility.  His referral package shows low total testosterone  of 252 in June 2023 and another low total testosterone  of 250 in October 2024.  However, his most recent labs from March and September his testosterone  levels are within normal range-436 in March and 326 in September with normal 6 hormone binding globulin, however significantly suppressed FSH of 1.4 both times and LH low normal at 2.8 most recently in September 2025.  -Both of these times, he was observed to have significantly above target ferritin level.  Patient denies any family history of hemochromatosis.   His medical history indicates hyperlipidemia-not on treatment.  His LDL and total cholesterol still above target-Showing a trend of improvement.  No  coronary artery disease.  No family history of such concern.  He has engaged in lifestyle modification which allowed him to lose about 12 pounds over the last year.  He is active, exercises regularly.  He is a social alcohol user.  He is not a smoker. He does not have acute complaints at this time. Review of Systems  Constitutional: + Progressively losing weight,  no fatigue, no subjective hyperthermia, no subjective hypothermia   Objective:       12/08/2023    9:02 AM 06/04/2023    8:30 AM 05/19/2023    2:39 PM  Vitals with BMI  Height 6' 3 6' 3 6' 3  Weight 221 lbs 10 oz 226 lbs 10 oz 228 lbs 10 oz  BMI 27.7 28.32 28.57  Systolic 106 108 895  Diastolic 76 70 76  Pulse 56 68 68    BP 106/76   Pulse (!) 56   Ht 6' 3 (1.905 m)   Wt 221 lb 9.6 oz (100.5 kg)   BMI 27.70 kg/m   Wt Readings from Last 3 Encounters:  12/08/23 221 lb 9.6 oz (100.5 kg)  06/04/23 226 lb 9.6 oz (102.8 kg)  05/19/23 228 lb  9.6 oz (103.7 kg)    Physical Exam  Constitutional:  Body mass index is 27.7 kg/m.,  not in acute distress, normal state of mind Eyes: PERRLA, EOMI, no exophthalmos ENT: moist mucous membranes, no gross thyromegaly, no gross cervical lymphadenopathy  Genital: Testicular size 20 cc bilaterally, no intrascrotal mass lesion, no hernia.  Normal male external escutcheon. Skin: moist, warm, no rashes Neurological: no tremor with outstretched hands, Deep tendon reflexes normal in bilateral lower extremities.   05/20/2021 labs: Total testosterone  253 December 23, 2022 labs: Total testosterone  250  Recent Results (from the past 2160 hours)  Testosterone , Free, Total, SHBG     Status: None (Preliminary result)   Collection Time: 12/04/23  8:41 AM  Result Value Ref Range   Testosterone  326 264 - 916 ng/dL    Comment: Adult male reference interval is based on a population of healthy nonobese males (BMI <30) between 84 and 78 years old. Travison, et.al. JCEM [id number]. PMID: 71675896.    Testosterone , Free WILL FOLLOW    Sex Hormone Binding 27.8 16.5 - 55.9 nmol/L  Ferritin     Status: Abnormal   Collection Time: 12/04/23  8:41 AM  Result Value Ref Range   Ferritin 518 (H) 30 - 400 ng/mL  Luteinizing hormone     Status: None   Collection Time: 12/04/23  8:41 AM  Result Value Ref Range   LH 2.8 1.7 - 8.6 mIU/mL  Follicle stimulating hormone     Status: Abnormal   Collection Time: 12/04/23  8:41 AM  Result Value Ref Range   FSH 1.4 (L) 1.5 - 12.4 mIU/mL  Lipid panel     Status: Abnormal   Collection Time: 12/04/23  8:41 AM  Result Value Ref Range   Cholesterol, Total 182 100 - 199 mg/dL   Triglycerides 92 0 - 149 mg/dL   HDL 38 (L) >60 mg/dL   VLDL Cholesterol Cal 17 5 - 40 mg/dL   LDL Chol Calc (NIH) 872 (H) 0 - 99 mg/dL   Chol/HDL Ratio 4.8 0.0 - 5.0 ratio    Comment:                                   T. Chol/HDL Ratio  Men   Women                               1/2 Avg.Risk  3.4    3.3                                   Avg.Risk  5.0    4.4                                2X Avg.Risk  9.6    7.1                                3X Avg.Risk 23.4   11.0   Lipoprotein A (LPA)     Status: None   Collection Time: 12/04/23  8:41 AM  Result Value Ref Range   Lipoprotein (a) 28.9 <75.0 nmol/L    Comment: Note:  Values greater than or equal to 75.0 nmol/L may        indicate an independent risk factor for CHD,        but must be evaluated with caution when applied        to non-Caucasian populations due to the        influence of genetic factors on Lp(a) across        ethnicities.      Assessment & Plan:   1. Hypogonadism- resolved 2.  Persistently depressed FSH 3.  Persistently elevated ferritin 4. Mixed hyperlipidemia  - Brandon Navarro  is being seen at a kind request of Kayla Drivers, MD. - I have reviewed his new and available  records and clinically evaluated the patient. 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 His FSH remains significantly suppressed while LH is inappropriately normal.  I had a long discussion with the patient that we will need to assess for secondary hypogonadism with pituitary protocol MRI before we attempt to replace Androgen. he will not need immediate testosterone  replacement therapy, however will need a follow-up measurements as below. Also, I want him to be evaluated by hematology from the point of view of persistently elevated ferritin. His history is not revealing for any family history of hemochromatosis.   He continues to have significant dyslipidemia-with significant improvement on his most recent labs.  Lifestyle medicine nutrition was discussed and recommended to him.  If he is LDL remains above 130 mg/day, he may benefit from early intervention with statins.   - he acknowledges that  there is a room for improvement in his food and drink choices. - Suggestion is made for him to avoid simple carbohydrates  from his diet including Cakes, Sweet Desserts, Ice Cream, Soda (diet and regular), Sweet Tea, Candies, Chips, Cookies, Store Bought Juices, Alcohol , Artificial Sweeteners,  Coffee Creamer, and Sugar-free Products, Lemonade. This will help patient to have more stable blood glucose profile and potentially avoid unintended weight gain.  The following Lifestyle Medicine recommendations according to American College of Lifestyle Medicine  [facility]) were discussed and and offered to patient and he  agrees to start the journey:  A. Whole Foods, Plant-Based Nutrition comprising of fruits and vegetables, plant-based proteins, whole-grain carbohydrates was discussed in detail  with the patient.   A list for source of those nutrients were also provided to the patient.  Patient will use only water or unsweetened tea for hydration. B.  The need to stay away from risky substances including alcohol, smoking; obtaining 7 to 9 hours of restorative sleep, at least 150 minutes of moderate intensity exercise weekly, the importance of healthy social connections,  and stress management techniques were discussed. C.  A full color page of  Calorie density of various food groups per pound showing examples of each food groups was provided to the patient.   He is encouraged to stay on the lifestyle measures to allow him to lose some more weight which might continue to improve total testosterone  .   - he is advised to maintain close follow up with Kayla Drivers, MD for primary care needs.   I spent  25  minutes in the care of the patient today including review of labs from Thyroid Function, CMP, and other relevant labs ; imaging/biopsy records (current and previous including abstractions from other facilities); face-to-face time discussing  his lab results and symptoms, medications doses, his options of  short and long term treatment based on the latest standards of care / guidelines;   and documenting the encounter.  Brandon Navarro  participated in the discussions, expressed understanding, and voiced agreement with the above plans.  All questions were answered to his satisfaction. he is encouraged to contact clinic should he have any questions or concerns prior to his return visit.   Follow up plan: Return in about 3 months (around 03/08/2024), or MRI anytime, for F/U with Pre-visit Labs.   Ranny Earl, MD [facility] [address] [place], [place] [address] Phone: [phone]  Fax: [phone]     12/08/2023, 12:01 PM  This note was partially dictated with voice recognition software. Similar sounding words can be transcribed inadequately or may not  be corrected upon review.

## 2023-12-11 LAB — FOLLICLE STIMULATING HORMONE: FSH: 1.4 m[IU]/mL — ABNORMAL LOW (ref 1.5–12.4)

## 2023-12-11 LAB — TESTOSTERONE, FREE, TOTAL, SHBG
Sex Hormone Binding: 27.8 nmol/L (ref 16.5–55.9)
Testosterone, Free: 8.6 pg/mL — ABNORMAL LOW (ref 8.7–25.1)
Testosterone: 326 ng/dL (ref 264–916)

## 2023-12-11 LAB — LIPID PANEL
Chol/HDL Ratio: 4.8 ratio (ref 0.0–5.0)
Cholesterol, Total: 182 mg/dL (ref 100–199)
HDL: 38 mg/dL — ABNORMAL LOW (ref >39)
LDL Chol Calc (NIH): 127 mg/dL — ABNORMAL HIGH (ref 0–99)
Triglycerides: 92 mg/dL (ref 0–149)
VLDL Cholesterol Cal: 17 mg/dL (ref 5–40)

## 2023-12-11 LAB — LIPOPROTEIN A (LPA): Lipoprotein (a): 28.9 nmol/L (ref <75.0)

## 2023-12-11 LAB — LUTEINIZING HORMONE: LH: 2.8 m[IU]/mL (ref 1.7–8.6)

## 2023-12-11 LAB — FERRITIN: Ferritin: 518 ng/mL — ABNORMAL HIGH (ref 30–400)

## 2023-12-14 ENCOUNTER — Ambulatory Visit (HOSPITAL_COMMUNITY)
Admission: RE | Admit: 2023-12-14 | Discharge: 2023-12-14 | Disposition: A | Discharge status: Home / Self Care | Source: Ambulatory Visit | Attending: "Endocrinology | Admitting: "Endocrinology

## 2023-12-14 DIAGNOSIS — E291 Testicular hypofunction: Secondary | ICD-10-CM | POA: Insufficient documentation

## 2023-12-14 MED ORDER — GADOBUTROL 1 MMOL/ML IV SOLN
10.0000 mL/kg | Freq: Once | INTRAVENOUS | Status: AC | PRN
  Administered 2023-12-14: 1005 mL via INTRAVENOUS

## 2023-12-20 NOTE — Progress Notes (Unsigned)
 [facility]   INITIAL CONSULT NOTE  Patient Care Team: Kayla Drivers, MD as PCP - [facility])  Hematological/Oncological History Labs from endocrinologist, Dr. Lenis: 05/26/2023: Ferritin 584 (H) 12/04/2023: Ferritin 518 (H) 12/21/2023: Establish care with [facility]  CHIEF COMPLAINTS/PURPOSE OF CONSULTATION:  Elevated Ferritin   HISTORY OF PRESENTING ILLNESS:  Brandon Navarro 38 y.o. male presents to the hematology practice for evaluation for elevated ferritin levels.  He is unaccompanied for this visit.  On exam today, Brandon Navarro reports his energy and appetite are overall stable.  He is able to complete his ADLs on his own.  He denies nausea, vomiting or any bowel habit changes.  He denies easy bruising or signs of active bleeding.  Patient denies fevers, chills, night sweats, shortness of breath, chest pain, cough, headaches, dizziness or skin changes.  He has no other complaints.  Rest of the 10 point ROS as below.  MEDICAL HISTORY:  History reviewed. No pertinent past medical history.  SURGICAL HISTORY: Past Surgical History:  Procedure Laterality Date   APPENDECTOMY      SOCIAL HISTORY: Social History   Socioeconomic History   Marital status: Married    Spouse name: Not on file   Number of children: Not on file   Years of education: Not on file   Highest education level: Not on file  Occupational History   Not on file  Tobacco Use   Smoking status: Never   Smokeless tobacco: Never  Vaping Use   Vaping status: Never Used  Substance and Sexual Activity   Alcohol use: Yes    Comment: ocassionally   Drug use: No   Sexual activity: Yes  Other Topics Concern   Not on file  Social History Narrative   Not on file   Social Drivers of Health   Financial Resource Strain: Not on file  Food Insecurity: No Food Insecurity (12/21/2023)   Hunger Vital Sign    Worried About Running Out of Food in the Last Year: Never true    Ran  Out of Food in the Last Year: Never true  Transportation Needs: No Transportation Needs (12/21/2023)   PRAPARE - Administrator, Civil Service (Medical): No    Lack of Transportation (Non-Medical): No  Physical Activity: Not on file  Stress: Not on file  Social Connections: Not on file  Intimate Partner Violence: Not At Risk (12/21/2023)   Humiliation, Afraid, Rape, and Kick questionnaire    Fear of Current or Ex-Partner: No    Emotionally Abused: No    Physically Abused: No    Sexually Abused: No    FAMILY HISTORY: Family History  Problem Relation Age of Onset   Hyperlipidemia Mother     ALLERGIES:  has no known allergies.  MEDICATIONS:  Current Outpatient Medications  Medication Sig Dispense Refill   MULTIPLE VITAMINS PO Take 1 tablet by mouth daily.     No current facility-administered medications for this visit.    REVIEW OF SYSTEMS:   Constitutional: ( - ) fevers, ( - )  chills , ( - ) night sweats Eyes: ( - ) blurriness of vision, ( - ) double vision, ( - ) watery eyes Ears, nose, mouth, throat, and face: ( - ) mucositis, ( - ) sore throat Respiratory: ( - ) cough, ( - ) dyspnea, ( - ) wheezes Cardiovascular: ( - ) palpitation, ( - ) chest discomfort, ( - ) lower extremity swelling Gastrointestinal:  ( - )  nausea, ( - ) heartburn, ( - ) change in bowel habits Skin: ( - ) abnormal skin rashes Lymphatics: ( - ) new lymphadenopathy, ( - ) easy bruising Neurological: ( - ) numbness, ( - ) tingling, ( - ) new weaknesses Behavioral/Psych: ( - ) mood change, ( - ) new changes  All other systems were reviewed with the patient and are negative.  PHYSICAL EXAMINATION: ECOG PERFORMANCE STATUS: 0 - Asymptomatic  Vitals:   12/21/23 1134 12/21/23 1137  BP: 113/84   Pulse: (!) 54   Resp: 16   Temp:  (!) 97.5 F (36.4 C)  SpO2: 100%    Filed Weights   12/21/23 1134  Weight: 227 lb 4.7 oz (103.1 kg)    GENERAL: well appearing male in NAD  SKIN: skin color,  texture, turgor are normal, no rashes or significant lesions EYES: conjunctiva are pink and non-injected, sclera clear  LUNGS: clear to auscultation and percussion with normal breathing effort HEART: regular rate & rhythm and no murmurs and no lower extremity edema ABDOMEN: soft, non-tender, non-distended, normal bowel sounds Musculoskeletal: no cyanosis of digits and no clubbing  PSYCH: alert & oriented x 3, fluent speech NEURO: no focal motor/sensory deficits  LABORATORY DATA:  I have reviewed the data as listed    Latest Ref Rng & Units 12/21/2023   11:53 AM 05/26/2023    7:57 AM  CBC  WBC 4.0 - 10.5 K/uL 5.7  5.1   Hemoglobin 13.0 - 17.0 g/dL 85.1  84.8   Hematocrit 39.0 - 52.0 % 44.9  46.2   Platelets 150 - 400 K/uL 195  179         No data to display          RADIOGRAPHIC STUDIES: I have personally reviewed the radiological images as listed and agreed with the findings in the report. MR BRAIN W WO CONTRAST Addendum Date: 12/15/2023 ADDENDUM REPORT: 12/15/2023 15:39 ADDENDUM: Correction: 10 mL Gadavist  intravenous contrast was administered for this examination. Electronically Signed   By: Rockey Childs D.O.   On: 12/15/2023 15:39   Result Date: 12/15/2023 CLINICAL DATA:  Provided history: Hypogonadism, male. EXAM: MRI HEAD WITHOUT AND WITH CONTRAST TECHNIQUE: Multiplanar, multiecho pulse sequences of the brain and surrounding structures were obtained without and with intravenous contrast. CONTRAST:  1005mL GADAVIST  GADOBUTROL  1 MMOL/ML IV SOLN COMPARISON:  None. FINDINGS: Brain: Pituitary protocol sequences were acquired, including dynamic post-contrast imaging. Partially empty sella turcica, a nonspecific finding. No evidence of a focal pituitary lesion. The pituitary stalk is midline and unremarkable in appearance. Cerebral volume is normal. No cortical encephalomalacia is identified. No significant cerebral white matter disease. There is no acute infarct. No evidence of an  intracranial mass. No chronic intracranial blood products. No extra-axial fluid collection. No midline shift. No pathologic intracranial enhancement identified. Vascular: Maintained flow voids within the proximal large arterial vessels. 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 Skull and upper cervical spine: No focal worrisome marrow lesion. Sinuses/Orbits: No mass or acute finding within the imaged orbits. Mucous retention cysts measuring up to 2.3 cm, and mild background mucosal thickening, within the left maxillary sinus. IMPRESSION: 1. Partially empty sella turcica, a nonspecific finding. 2. No evidence of a focal pituitary lesion. 3. 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 4. Otherwise unremarkable MRI appearance of the brain. 5. Left maxillary sinus disease as described. Electronically Signed: By: Rockey Childs D.O. On: 12/15/2023 09:56    ASSESSMENT & PLAN  Brandon Navarro is a 38 y.o. male who presents to the clinic for evaluation for elevated ferritin levels.   Elevated serum ferritin levels have numerous possible etiologies. These include hereditary hemochromatosis (heterozygous or homozygous), inflammation, liver disease, or iron overload from an exogenous source. Hereditary hemochromatosis is a hereditary condition caused by mutations in the HFE gene, which regulates iron absorption. The most common genes mutated in this condition are the C282Y and H63D genes. Homozygous mutations represent a disease state which requires phlebotomy to decrease ferritin levels to a goal of <50  (Blood (2010) 116 (3): 317-325). The goal is to decrease ferritin so there is no deposition in critical organs (liver, heart, pancreas and thyroid). Heterozygous mutations (or compound heterozygotes) rarely require phlebotomy, but do have elevated serum iron/ferritin levels.  Ferritin is an acute phase reactant and can be elevated with systemic inflammation.  Direct damage to liver tissue can also cause spillage of ferritin into the blood, resulting in elevated ferritin.  Additionally, serum iron levels can be quite transient and an elevation or serum iron may not represent a true overload of total body iron (best lab for this is ferritin).   # Elevated Ferritin --labs to include CBC, CMP, LDH, ESR, CRP --will repeat iron panel and ferritin today --will send for HFE gene mutation. If found to have homozygous mutation for HFE will begin phlebotomies every other week with goal ferritin <50  --will order US  liver to assess for liver disease --if patient confirmed to have hereditary hemochromatosis will order TSH, TTE, and Hepatitis B/C panels.  --Placeholder to return in 6 months with labs   Orders Placed This Encounter  Procedures   US  Abdomen Complete    Standing Status:   Future    Expected Date:   12/21/2023    Expiration Date:   12/19/2024    Reason for Exam (SYMPTOM  OR DIAGNOSIS REQUIRED):   elecated ferritin    Preferred imaging location?:   [facility]   CBC with Differential    Standing Status:   Future    Number of Occurrences:   1    Expected Date:   12/21/2023    Expiration Date:   03/20/2024   Comprehensive metabolic panel    Standing Status:   Future    Number of Occurrences:   1    Expected Date:   12/21/2023    Expiration Date:   03/20/2024   Lactate dehydrogenase    Standing Status:   Future    Number of Occurrences:   1    Expected Date:   12/21/2023    Expiration Date:   03/20/2024   Sedimentation rate    Standing Status:   Future    Number of Occurrences:   1    Expected Date:   12/21/2023    Expiration Date:   03/20/2024   C-reactive protein    Standing Status:   Future    Number of Occurrences:   1    Expected Date:   12/21/2023    Expiration Date:   03/20/2024   Iron and TIBC ([facility] ONLY)    Standing Status:   Future    Number of Occurrences:   1    Expected Date:   12/21/2023     Expiration Date:   03/20/2024   Ferritin    Standing Status:   Future    Number of Occurrences:   1    Expected Date:   12/21/2023  Expiration Date:   03/20/2024   Hemochromatosis DNA-PCR(c282y,h63d)    Standing Status:   Future    Number of Occurrences:   1    Expected Date:   12/21/2023    Expiration Date:   03/20/2024    All questions were answered. The patient knows to call the clinic with any problems, questions or concerns.  I have spent a total of 60 minutes minutes of face-to-face and non-face-to-face time, preparing to see the patient, obtaining and/or reviewing separately obtained history, performing a medically appropriate examination, counseling and educating the patient, ordering medications/tests/procedures, referring and communicating with other health care professionals, documenting clinical information in the electronic health record, independently interpreting results and communicating results to the patient, and care coordination.   Johnston Police, PA-C Department of Hematology/Oncology [facility] at [facility]

## 2023-12-21 ENCOUNTER — Encounter: Payer: Self-pay | Admitting: Physician Assistant

## 2023-12-21 ENCOUNTER — Inpatient Hospital Stay: Attending: Physician Assistant | Admitting: Physician Assistant

## 2023-12-21 ENCOUNTER — Inpatient Hospital Stay

## 2023-12-21 VITALS — BP 113/84 | HR 54 | Temp 97.5°F | Resp 16 | Wt 227.3 lb

## 2023-12-21 DIAGNOSIS — R7989 Other specified abnormal findings of blood chemistry: Secondary | ICD-10-CM

## 2023-12-21 LAB — COMPREHENSIVE METABOLIC PANEL WITH GFR
ALT: 32 U/L (ref 0–44)
AST: 25 U/L (ref 15–41)
Albumin: 4.3 g/dL (ref 3.5–5.0)
Alkaline Phosphatase: 50 U/L (ref 38–126)
Anion gap: 5 (ref 5–15)
BUN: 15 mg/dL (ref 6–20)
CO2: 26 mmol/L (ref 22–32)
Calcium: 8.9 mg/dL (ref 8.9–10.3)
Chloride: 104 mmol/L (ref 98–111)
Creatinine, Ser: 0.95 mg/dL (ref 0.61–1.24)
GFR, Estimated: >60 mL/min (ref >60)
Glucose, Bld: 94 mg/dL (ref 70–99)
Potassium: 4.4 mmol/L (ref 3.5–5.1)
Sodium: 135 mmol/L (ref 135–145)
Total Bilirubin: 0.8 mg/dL (ref 0.0–1.2)
Total Protein: 7.1 g/dL (ref 6.5–8.1)

## 2023-12-21 LAB — CBC WITH DIFFERENTIAL/PLATELET
Abs Immature Granulocytes: 0.02 K/uL (ref 0.00–0.07)
Basophils Absolute: 0 K/uL (ref 0.0–0.1)
Basophils Relative: 1 %
Eosinophils Absolute: 0.2 K/uL (ref 0.0–0.5)
Eosinophils Relative: 3 %
HCT: 44.9 % (ref 39.0–52.0)
Hemoglobin: 14.8 g/dL (ref 13.0–17.0)
Immature Granulocytes: 0 %
Lymphocytes Relative: 36 %
Lymphs Abs: 2 K/uL (ref 0.7–4.0)
MCH: 30.6 pg (ref 26.0–34.0)
MCHC: 33 g/dL (ref 30.0–36.0)
MCV: 93 fL (ref 80.0–100.0)
Monocytes Absolute: 0.5 K/uL (ref 0.1–1.0)
Monocytes Relative: 9 %
Neutro Abs: 2.9 K/uL (ref 1.7–7.7)
Neutrophils Relative %: 51 %
Platelets: 195 K/uL (ref 150–400)
RBC: 4.83 MIL/uL (ref 4.22–5.81)
RDW: 12.2 % (ref 11.5–15.5)
WBC: 5.7 K/uL (ref 4.0–10.5)
nRBC: 0 % (ref 0.0–0.2)

## 2023-12-21 LAB — LACTATE DEHYDROGENASE: LDH: 139 U/L (ref 98–192)

## 2023-12-21 LAB — FERRITIN: Ferritin: 297 ng/mL (ref 24–336)

## 2023-12-21 LAB — IRON AND TIBC
Iron: 95 ug/dL (ref 45–182)
Saturation Ratios: 25 % (ref 17.9–39.5)
TIBC: 375 ug/dL (ref 250–450)
UIBC: 280 ug/dL

## 2023-12-21 LAB — C-REACTIVE PROTEIN: CRP: 0.6 mg/dL (ref <1.0)

## 2023-12-21 LAB — SEDIMENTATION RATE: Sed Rate: 1 mm/h (ref 0–15)

## 2023-12-24 LAB — HEMOCHROMATOSIS DNA-PCR(C282Y,H63D)

## 2023-12-28 ENCOUNTER — Ambulatory Visit (HOSPITAL_COMMUNITY)
Admission: RE | Admit: 2023-12-28 | Discharge: 2023-12-28 | Disposition: A | Discharge status: Home / Self Care | Source: Ambulatory Visit | Attending: Physician Assistant | Admitting: Physician Assistant

## 2023-12-28 ENCOUNTER — Telehealth: Payer: Self-pay | Admitting: Physician Assistant

## 2023-12-28 DIAGNOSIS — R7989 Other specified abnormal findings of blood chemistry: Secondary | ICD-10-CM | POA: Diagnosis present

## 2023-12-28 NOTE — Telephone Encounter (Signed)
 I called and spoke to [name] to review the lab results from 12/21/2023. 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  I reviewed that heterozygous mutations rarely require phlebotomy, but do have elevated serum iron/ferritin levels.  Ferritin levels were in normal range measuring 297. If upcoming US  abdomen results don't indicate iron overload levels, no further intervention is required. We will monitor ferritin levels closely in 3 months and 6 months with labs/follow up.   Discussed role of testing his children and following up with their pediatrician for appropriate timing.   [name] expressed understanding of the plan provided.

## 2023-12-29 ENCOUNTER — Other Ambulatory Visit: Payer: Self-pay | Admitting: *Deleted

## 2023-12-29 ENCOUNTER — Ambulatory Visit: Payer: Self-pay | Admitting: *Deleted

## 2023-12-29 DIAGNOSIS — R7989 Other specified abnormal findings of blood chemistry: Secondary | ICD-10-CM

## 2023-12-29 NOTE — Progress Notes (Signed)
 Patient aware and verbalized understanding.

## 2023-12-29 NOTE — Progress Notes (Signed)
 US  Scheduled for March 16 th - NPO pMN arrive at 9:15

## 2023-12-29 NOTE — Progress Notes (Signed)
 LM for return call

## 2024-03-15 ENCOUNTER — Encounter: Payer: Self-pay | Admitting: "Endocrinology

## 2024-03-15 ENCOUNTER — Ambulatory Visit: Admitting: "Endocrinology

## 2024-03-15 VITALS — BP 116/76 | HR 96 | Resp 16 | Ht 75.0 in | Wt 225.6 lb

## 2024-03-15 DIAGNOSIS — E291 Testicular hypofunction: Secondary | ICD-10-CM

## 2024-03-15 DIAGNOSIS — R7989 Other specified abnormal findings of blood chemistry: Secondary | ICD-10-CM

## 2024-03-15 DIAGNOSIS — E782 Mixed hyperlipidemia: Secondary | ICD-10-CM

## 2024-03-15 NOTE — Progress Notes (Signed)
 "                                                     03/15/2024, 11:57 AM  Endocrinology follow-up note   Subjective:    Patient ID: Brandon Navarro, male    DOB: Jul 14, 1985, PCP Kayla Drivers, MD   History reviewed. No pertinent past medical history. Past Surgical History:  Procedure Laterality Date   APPENDECTOMY     Social History   Socioeconomic History   Marital status: Married    Spouse name: Not on file   Number of children: Not on file   Years of education: Not on file   Highest education level: Not on file  Occupational History   Not on file  Tobacco Use   Smoking status: Never   Smokeless tobacco: Never  Vaping Use   Vaping status: Never Used  Substance and Sexual Activity   Alcohol use: Yes    Comment: ocassionally   Drug use: No   Sexual activity: Yes  Other Topics Concern   Not on file  Social History Narrative   Not on file   Social Drivers of Health   Tobacco Use: Low Risk (03/15/2024)   Patient History    Smoking Tobacco Use: Never    Smokeless Tobacco Use: Never    Passive Exposure: Not on file  Financial Resource Strain: Not on file  Food Insecurity: No Food Insecurity (12/21/2023)   Epic    Worried About Programme Researcher, Broadcasting/film/video in the Last Year: Never true    Ran Out of Food in the Last Year: Never true  Transportation Needs: No Transportation Needs (12/21/2023)   Epic    Lack of Transportation (Medical): No    Lack of Transportation (Non-Medical): No  Physical Activity: Not on file  Stress: Not on file  Social Connections: Not on file  Depression (PHQ2-9): Low Risk (12/21/2023)   Depression (PHQ2-9)    PHQ-2 Score: 0  Alcohol Screen: Not on file  Housing: Low Risk (12/21/2023)   Epic    Unable to Pay for Housing in the Last Year: No    Number of Times Moved in the Last Year: 0    Homeless in the Last Year: No  Utilities: Not At Risk (12/21/2023)   Epic    Threatened with loss of utilities: No  Health Literacy: Not on file    Family History  Problem Relation Age of Onset   Hyperlipidemia Mother    Outpatient Encounter Medications as of 03/15/2024  Medication Sig   MULTIPLE VITAMINS PO Take 1 tablet by mouth daily.   No facility-administered encounter medications on file as of 03/15/2024.   ALLERGIES: No Known Allergies  VACCINATION STATUS:  There is no immunization history on file for this patient.  HPI Brandon Navarro is 38 y.o. male who presents today with a medical history as above. he is being seen in follow-up after he was seen in consultation for hypogonadism requested by Kayla Drivers, MD.     He reports that he was diagnosed with hypogonadism in the past was given testosterone  gel with mixed results.  He did not receive any testosterone  replacement therapy in the most recent couple of years.  He denies any significant history of testicular injury, chemotherapy, radiation therapy nor any significant head injury.  He has 2  young children.  Due to persistently elevated ferritin level, he was sent for GI evaluation.  He was diagnosed with hereditary hemochromatosis which will not need active intervention for now.  He denies any prior exposure to anabolic, androgenic steroids.  He underwent vasectomy, not looking for any more fertility.  His previsit labs show better testosterone  profile including total testosterone  366 ng per DL, and free testosterone  at 17.3, associated with low normal SHBG.  Patient denies any family history of hemochromatosis.   His medical history indicates hyperlipidemia-not on treatment.  His LDL and total cholesterol still above target despite modifications in his diet and regular exercise.  No  coronary artery disease.  No family history of such concern.    He is a social alcohol user.  He is not a smoker. He does not have acute complaints at this time. Review of Systems  Constitutional: + Progressively losing weight,  no fatigue, no subjective hyperthermia, no  subjective hypothermia   Objective:       03/15/2024    9:14 AM 12/21/2023   11:34 AM 12/08/2023    9:02 AM  Vitals with BMI  Height 6' 3  6' 3  Weight 225 lbs 10 oz 227 lbs 5 oz 221 lbs 10 oz  BMI 28.2 28.41 27.7  Systolic 116 113 893  Diastolic 76 84 76  Pulse 96 54 56    BP 116/76   Pulse 96   Resp 16   Ht 6' 3 (1.905 m)   Wt 225 lb 9.6 oz (102.3 kg)   SpO2 98%   BMI 28.20 kg/m   Wt Readings from Last 3 Encounters:  03/15/24 225 lb 9.6 oz (102.3 kg)  12/21/23 227 lb 4.7 oz (103.1 kg)  12/08/23 221 lb 9.6 oz (100.5 kg)    Physical Exam  Constitutional:  Body mass index is 28.2 kg/m.,  not in acute distress, normal state of mind Eyes: PERRLA, EOMI, no exophthalmos ENT: moist mucous membranes, no gross thyromegaly, no gross cervical lymphadenopathy  Genital: Testicular size 20 cc bilaterally, no intrascrotal mass lesion, no hernia.  Normal male external escutcheon. Skin: moist, warm, no rashes Neurological: no tremor with outstretched hands, Deep tendon reflexes normal in bilateral lower extremities.   05/20/2021 labs: Total testosterone  253 December 23, 2022 labs: Total testosterone  250  Recent Results (from the past 2160 hours)  Hemochromatosis DNA-PCR(c282y,h63d)     Status: None   Collection Time: 12/21/23 11:53 AM  Result Value Ref Range   DNA Mutation Analysis Comment     Comment: (NOTE) Results: c.845G>A (p.Cys282Tyr) - Not Detected c.187C>G (p.His63Asp) - Detected, heterozygous c.193A>T (p.Ser65Cys) - Not Detected Not associated with increased risk to develop clinical symptoms of Hereditary Hemochromatosis. In symptomatic individuals, other causes of iron overload should be evaluated. See Additional Information and Comments. Additional Clinical Information: Hereditary hemochromatosis (HFE related) is an autosomal recessive iron storage disorder. Patients may have a genetic diagnosis of hereditary hemochromatosis and never show clinical  symptoms. Clinical symptoms typically appear between 40 to 60 years in males and after menopause in females. Signs and symptoms may include organ damage, primarily in the liver, risk for hepatocellular carcinoma, diabetes, and heart disease due to iron accumulation. Life expectancy may be decreased in individuals who develop cirrhosis. Treatment for clinically symptomatic individuals may include therapeutic ph lebotomy. Liver transplant may be used to treat end stage liver failure. For preventive care, monitoring for iron overload is recommended for patients who are homozygous for c.845G>A (p.Cys282Tyr) and have yet  to experience clinical symptoms. Comments: The most common HFE variants associated with hereditary hemochromatosis are c.845G>A (p.Cys282Tyr), c.187C>G (p.His63Asp), c.193A>T (p.Ser65Cys). While patients homozygous for c.845G>A (p.Cys282Tyr) are the most likely to present clinical symptoms, less than 10% develop clinically significant iron overload with tissue and organ damage. Genetic counseling is recommended to discuss the potential clinical implications of positive results, as well as recommendations for testing family members. Genetic Coordinators are available for health care providers to discuss results at [phone]). Test Details: Three variants analyzed: c.845G>A (p.Cys282Tyr), commonly referred to as C282Y c.187C>G (p.His63Asp), commonly re ferred to as H63D c.193A>T (p.Ser65Cys), commonly referred to as S65C Methods/Limitations: DNA Analysis of the HFE gene (NM_[id number]) was performed by PCR amplification followed by restriction enzyme digestion analyses. Results must be combined with clinical information for the most accurate interpretation. Molecular-based testing is highly accurate, but as in any laboratory test, diagnostic errors may occur. False positive or false negative results may occur for reasons that include genetic variants, blood  transfusions, bone marrow transplantation, somatic or tissue-specific mosaicism, mislabeled samples, or erroneous representation of family relationships. This test was developed and its performance characteristics determined by [facility]. It has not been cleared or approved by the Food and Drug Administration. References: Aldona CAVES, [address] Kowdley SONNA Monte LW, [place] AS; American Association for the Study of Liver Diseases. Diagnosis and management of hemochromat osis: 2011 practice guideline by the American Association for the Study of Liver Diseases. Hepatology. 2011 Jul;54(1):328-43. doi: 10.1002/hep.24330. PMID: 78547709; PMCID: EFR6850874. [address] Brissot P, Swinkels DW, Zoller H, Kamarainen O, Patton S, Alonso I, Morris M, Keeney S. EMQN best practice guidelines for the molecular genetic diagnosis of hereditary hemochromatosis [facility]). Eur J Hum Genet. 2016 Apr;24(4):479-95. doi: 10.1038/ejhg.2015.128. Epub 2015 Jul 8. PMID: 73846781; PMCID: EFR5070138.    Reviewed by: Comment     Comment: (NOTE) Technical Component performed at [facility] Professional Component performed by: Fairy WENDI Aid, PhD, [facility] [id number], [facility], [address] [place] [address] Performed At: [facility] [address] [place], [place] [id number] Loran Gales MDPhD Ey:[phone]   Sedimentation rate     Status: None   Collection Time: 12/21/23 11:53 AM  Result Value Ref Range   Sed Rate 1 0 - 15 mm/hr    Comment: Performed at [facility], [address] [place], [place] [address]  Lactate dehydrogenase     Status: None   Collection Time: 12/21/23 11:53 AM  Result Value Ref Range   LDH 139 98 - 192 U/L    Comment: Performed at [facility], [address] [place], [place] [address]  Comprehensive metabolic panel     Status: None   Collection Time: 12/21/23 11:53 AM  Result Value Ref Range   Sodium 135 135 - 145 mmol/L   Potassium 4.4 3.5 - 5.1 mmol/L   Chloride 104 98 - 111 mmol/L    CO2 26 22 - 32 mmol/L   Glucose, Bld 94 70 - 99 mg/dL    Comment: Glucose reference range applies only to samples taken after fasting for at least 8 hours.   BUN 15 6 - 20 mg/dL   Creatinine, Ser 9.04 0.61 - 1.24 mg/dL   Calcium 8.9 8.9 - 89.6 mg/dL   Total Protein 7.1 6.5 - 8.1 g/dL   Albumin 4.3 3.5 - 5.0 g/dL   AST 25 15 - 41 U/L   ALT 32 0 - 44 U/L   Alkaline Phosphatase 50 38 - 126 U/L  Total Bilirubin 0.8 0.0 - 1.2 mg/dL   GFR, Estimated >39 >39 mL/min    Comment: (NOTE) Calculated using the CKD-EPI Creatinine Equation (2021)    Anion gap 5 5 - 15    Comment: Performed at [facility], [address], [place], [place] [address]  CBC with Differential     Status: None   Collection Time: 12/21/23 11:53 AM  Result Value Ref Range   WBC 5.7 4.0 - 10.5 K/uL   RBC 4.83 4.22 - 5.81 MIL/uL   Hemoglobin 14.8 13.0 - 17.0 g/dL   HCT 55.0 60.9 - 47.9 %   MCV 93.0 80.0 - 100.0 fL   MCH 30.6 26.0 - 34.0 pg   MCHC 33.0 30.0 - 36.0 g/dL   RDW 87.7 88.4 - 84.4 %   Platelets 195 150 - 400 K/uL   nRBC 0.0 0.0 - 0.2 %   Neutrophils Relative % 51 %   Neutro Abs 2.9 1.7 - 7.7 K/uL   Lymphocytes Relative 36 %   Lymphs Abs 2.0 0.7 - 4.0 K/uL   Monocytes Relative 9 %   Monocytes Absolute 0.5 0.1 - 1.0 K/uL   Eosinophils Relative 3 %   Eosinophils Absolute 0.2 0.0 - 0.5 K/uL   Basophils Relative 1 %   Basophils Absolute 0.0 0.0 - 0.1 K/uL   Immature Granulocytes 0 %   Abs Immature Granulocytes 0.02 0.00 - 0.07 K/uL    Comment: Performed at [facility], [address], [place], [place] [address]  Ferritin     Status: None   Collection Time: 12/21/23 11:54 AM  Result Value Ref Range   Ferritin 297 24 - 336 ng/mL    Comment: Performed at [facility], [address] [place], [place] [address]  Iron and TIBC ([facility] ONLY)     Status: None   Collection Time: 12/21/23 11:54 AM  Result Value Ref Range   Iron 95 45 - 182 ug/dL   TIBC 624 749 - 549 ug/dL   Saturation  Ratios 25 17.9 - 39.5 %   UIBC 280 ug/dL    Comment: Performed at [facility], [address] [place], [place] [address]  C-reactive protein     Status: None   Collection Time: 12/21/23 11:54 AM  Result Value Ref Range   CRP 0.6 <1.0 mg/dL    Comment: Performed at [facility], [address], [place], [place] [address]  Transferrin Saturation     Status: None (Preliminary result)   Collection Time: 03/10/24  8:41 AM  Result Value Ref Range   IRON SERPL-[facility] WILL FOLLOW    IRON SATN MFR SERPL WILL FOLLOW    TRANSFERRIN SERPL-[facility] WILL FOLLOW   Testosterone , Free, Total, SHBG     Status: None   Collection Time: 03/10/24  8:41 AM  Result Value Ref Range   Testosterone  366 264 - 916 ng/dL    Comment: Adult male reference interval is based on a population of healthy nonobese males (BMI <30) between 88 and 72 years old. Travison, et.al. JCEM [phone]. PMID: 71675896. **Verified by repeat analysis**    Testosterone , Free 17.3 8.7 - 25.1 pg/mL   Sex Hormone Binding 19.8 16.5 - 55.9 nmol/L  Luteinizing hormone     Status: None   Collection Time: 03/10/24  8:41 AM  Result Value Ref Range   LH 1.8 1.7 - 8.6 mIU/mL  Follicle stimulating hormone     Status: Abnormal   Collection Time: 03/10/24  8:41 AM  Result Value Ref Range   FSH 1.3 (L) 1.5 - 12.4 mIU/mL     Assessment & Plan:   1. Hypogonadism- resolved 2.  Persistently depressed FSH 3.  Persistently elevated ferritin 4. Mixed hyperlipidemia 5.  Hereditary hemochromatosis   - I have reviewed his new and available  records and clinically evaluated the patient. 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 His FSH remains significantly suppressed while LH is inappropriately normal.  In the interim, he was diagnosed with hereditary hemochromatosis which will not need active intervention for now.   His previsit, full set labs did  not document hypogonadism, would not be considered for androgen replacement therapy for now.    I had a long discussion with the patient about increased risk of liver disease and cardiovascular disease.  He was approached for intervention with statins, however patient hesitates for now wishing to lower his lipid profile by further modification of his diet and exercise.   He is encouraged to follow-up with gastroenterology regarding hemochromatosis.  Lifestyle medicine whole food plant slant nutrition was discussed and recommended to him.   - he acknowledges that there is a room for improvement in his food and drink choices. - Suggestion is made for him to avoid simple carbohydrates  from his diet including Cakes, Sweet Desserts, Ice Cream, Soda (diet and regular), Sweet Tea, Candies, Chips, Cookies, Store Bought Juices, Alcohol , Artificial Sweeteners,  Coffee Creamer, and Sugar-free Products, Lemonade. This will help patient to have more stable blood glucose profile and potentially avoid unintended weight gain.  The following Lifestyle Medicine recommendations according to American College of Lifestyle Medicine  [facility]) were discussed and and offered to patient and he  agrees to start the journey:  A. Whole Foods, Plant-Based Nutrition comprising of fruits and vegetables, plant-based proteins, whole-grain carbohydrates was discussed in detail with the patient.   A list for source of those nutrients were also provided to the patient.  Patient will use only water or unsweetened tea for hydration. B.  The need to stay away from risky substances including alcohol, smoking; obtaining 7 to 9 hours of restorative sleep, at least 150 minutes of moderate intensity exercise weekly, the importance of healthy social connections,  and stress management techniques were discussed. C.  A full color page of  Calorie density of various food groups per pound showing examples of each food groups was provided to the  patient.    He is encouraged to stay on active lifestyle and return in 5 months with repeat labs for testosterone , lipids, and liver fibrosis score.   - he is advised to maintain close follow up with Kayla Drivers, MD for primary care needs.   I spent  25  minutes in the care of the patient today including review of labs from Thyroid Function, CMP, and other relevant labs ; imaging/biopsy records (current and previous including abstractions from other facilities); face-to-face time discussing  his lab results and symptoms, medications doses, his options of short and long term treatment based on the latest standards of care / guidelines;   and documenting the encounter.  Morene ONEIDA Darting  participated in the discussions, expressed understanding, and voiced agreement with the above plans.  All questions were answered to his satisfaction. he is encouraged to contact clinic should he have any questions or concerns prior to his return visit.    Follow up plan: Return in about 5 months (  around 08/13/2024) for Fasting Labs  in AM B4 8.   Ranny Earl, MD [facility] [address] [place], [place] [address] Phone: [phone]  Fax: [phone]     03/15/2024, 11:57 AM  This note was partially dictated with voice recognition software. Similar sounding words can be transcribed inadequately or may not  be corrected upon review.  "

## 2024-03-17 LAB — TESTOSTERONE, FREE, TOTAL, SHBG
Sex Hormone Binding: 19.8 nmol/L (ref 16.5–55.9)
Testosterone, Free: 17.3 pg/mL (ref 8.7–25.1)
Testosterone: 366 ng/dL (ref 264–916)

## 2024-03-17 LAB — TRANSFERRIN SATURATION
IRON SATN MFR SERPL: 20 %{saturation}
IRON SERPL-MCNC: 70 ug/dL
TRANSFERRIN SERPL-MCNC: 244 mg/dL

## 2024-03-17 LAB — FOLLICLE STIMULATING HORMONE: FSH: 1.3 m[IU]/mL — ABNORMAL LOW (ref 1.5–12.4)

## 2024-03-17 LAB — LUTEINIZING HORMONE: LH: 1.8 m[IU]/mL (ref 1.7–8.6)

## 2024-03-21 ENCOUNTER — Encounter: Payer: Self-pay | Admitting: *Deleted

## 2024-03-21 ENCOUNTER — Inpatient Hospital Stay: Attending: Physician Assistant

## 2024-03-21 DIAGNOSIS — R7989 Other specified abnormal findings of blood chemistry: Secondary | ICD-10-CM | POA: Insufficient documentation

## 2024-03-21 LAB — CBC WITH DIFFERENTIAL/PLATELET
Abs Immature Granulocytes: 0.06 K/uL (ref 0.00–0.07)
Basophils Absolute: 0.1 K/uL (ref 0.0–0.1)
Basophils Relative: 1 %
Eosinophils Absolute: 0.1 K/uL (ref 0.0–0.5)
Eosinophils Relative: 1 %
HCT: 42.9 % (ref 39.0–52.0)
Hemoglobin: 14.3 g/dL (ref 13.0–17.0)
Immature Granulocytes: 1 %
Lymphocytes Relative: 35 %
Lymphs Abs: 3.6 K/uL (ref 0.7–4.0)
MCH: 29.9 pg (ref 26.0–34.0)
MCHC: 33.3 g/dL (ref 30.0–36.0)
MCV: 89.7 fL (ref 80.0–100.0)
Monocytes Absolute: 0.8 K/uL (ref 0.1–1.0)
Monocytes Relative: 8 %
Neutro Abs: 5.7 K/uL (ref 1.7–7.7)
Neutrophils Relative %: 54 %
Platelets: 251 K/uL (ref 150–400)
RBC: 4.78 MIL/uL (ref 4.22–5.81)
RDW: 12.5 % (ref 11.5–15.5)
WBC: 10.4 K/uL (ref 4.0–10.5)
nRBC: 0 % (ref 0.0–0.2)

## 2024-03-21 LAB — COMPREHENSIVE METABOLIC PANEL WITH GFR
ALT: 29 U/L (ref 0–44)
AST: 27 U/L (ref 15–41)
Albumin: 4.6 g/dL (ref 3.5–5.0)
Alkaline Phosphatase: 69 U/L (ref 38–126)
Anion gap: 8 (ref 5–15)
BUN: 20 mg/dL (ref 6–20)
CO2: 32 mmol/L (ref 22–32)
Calcium: 9.2 mg/dL (ref 8.9–10.3)
Chloride: 98 mmol/L (ref 98–111)
Creatinine, Ser: 1.08 mg/dL (ref 0.61–1.24)
GFR, Estimated: >60 mL/min
Glucose, Bld: 90 mg/dL (ref 70–99)
Potassium: 3.8 mmol/L (ref 3.5–5.1)
Sodium: 138 mmol/L (ref 135–145)
Total Bilirubin: 0.4 mg/dL (ref 0.0–1.2)
Total Protein: 7.1 g/dL (ref 6.5–8.1)

## 2024-03-21 LAB — FERRITIN: Ferritin: 522 ng/mL — ABNORMAL HIGH (ref 24–336)

## 2024-03-21 LAB — IRON AND TIBC
Iron: 66 ug/dL (ref 45–182)
Saturation Ratios: 19 % (ref 17.9–39.5)
TIBC: 347 ug/dL (ref 250–450)
UIBC: 281 ug/dL

## 2024-04-04 ENCOUNTER — Other Ambulatory Visit (HOSPITAL_COMMUNITY): Payer: Self-pay | Admitting: Internal Medicine

## 2024-04-04 ENCOUNTER — Ambulatory Visit (HOSPITAL_COMMUNITY): Payer: Self-pay | Admitting: Physician Assistant

## 2024-04-04 DIAGNOSIS — R911 Solitary pulmonary nodule: Secondary | ICD-10-CM

## 2024-04-13 ENCOUNTER — Ambulatory Visit (HOSPITAL_COMMUNITY)
Admission: RE | Admit: 2024-04-13 | Discharge: 2024-04-13 | Disposition: A | Discharge status: Home / Self Care | Source: Ambulatory Visit | Attending: Internal Medicine | Admitting: Internal Medicine

## 2024-04-13 DIAGNOSIS — R911 Solitary pulmonary nodule: Secondary | ICD-10-CM | POA: Diagnosis present

## 2024-06-06 ENCOUNTER — Other Ambulatory Visit (HOSPITAL_COMMUNITY)

## 2024-06-14 ENCOUNTER — Other Ambulatory Visit

## 2024-06-21 ENCOUNTER — Ambulatory Visit: Admitting: Oncology

## 2024-06-21 ENCOUNTER — Inpatient Hospital Stay: Admitting: Oncology

## 2024-08-18 ENCOUNTER — Ambulatory Visit: Admitting: "Endocrinology
